# Patient Record
Sex: Male | Born: 1950 | Race: White | Hispanic: Yes | Marital: Single | Smoking: Never smoker
Health system: Southern US, Community
[De-identification: ages and names within clinical notes are randomized; demographics above are authoritative.]

## PROBLEM LIST (undated history)

## (undated) DIAGNOSIS — E119 Type 2 diabetes mellitus without complications: Secondary | ICD-10-CM

## (undated) DIAGNOSIS — H409 Unspecified glaucoma: Secondary | ICD-10-CM

## (undated) DIAGNOSIS — M1990 Unspecified osteoarthritis, unspecified site: Secondary | ICD-10-CM

## (undated) DIAGNOSIS — Z860100 Personal history of colon polyps, unspecified: Secondary | ICD-10-CM

## (undated) DIAGNOSIS — F419 Anxiety disorder, unspecified: Secondary | ICD-10-CM

## (undated) DIAGNOSIS — E785 Hyperlipidemia, unspecified: Secondary | ICD-10-CM

## (undated) DIAGNOSIS — M545 Low back pain, unspecified: Secondary | ICD-10-CM

## (undated) DIAGNOSIS — F32A Depression, unspecified: Secondary | ICD-10-CM

## (undated) DIAGNOSIS — K219 Gastro-esophageal reflux disease without esophagitis: Secondary | ICD-10-CM

## (undated) DIAGNOSIS — D649 Anemia, unspecified: Secondary | ICD-10-CM

## (undated) DIAGNOSIS — I10 Essential (primary) hypertension: Secondary | ICD-10-CM

## (undated) HISTORY — DX: Personal history of colon polyps, unspecified: Z86.0100

## (undated) HISTORY — DX: Gastro-esophageal reflux disease without esophagitis: K21.9

## (undated) HISTORY — DX: Type 2 diabetes mellitus without complications: E11.9

## (undated) HISTORY — DX: Depression, unspecified: F32.A

## (undated) HISTORY — DX: Unspecified glaucoma: H40.9

## (undated) HISTORY — DX: Anxiety disorder, unspecified: F41.9

## (undated) HISTORY — DX: Essential (primary) hypertension: I10

## (undated) HISTORY — DX: Hyperlipidemia, unspecified: E78.5

## (undated) HISTORY — DX: Anemia, unspecified: D64.9

## (undated) HISTORY — DX: Low back pain, unspecified: M54.50

## (undated) HISTORY — PX: BACK SURGERY: SHX140

## (undated) HISTORY — DX: Unspecified osteoarthritis, unspecified site: M19.90

## (undated) HISTORY — DX: Personal history of colonic polyps: Z86.010

---
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
## 1998-02-11 ENCOUNTER — Encounter: Admission: RE | Admit: 1998-02-11 | Discharge: 1998-05-12 | Payer: Self-pay | Admitting: *Deleted

## 1998-02-18 ENCOUNTER — Encounter: Admission: RE | Admit: 1998-02-18 | Discharge: 1998-04-09 | Payer: Self-pay | Admitting: *Deleted

## 1998-02-25 ENCOUNTER — Encounter: Payer: Self-pay | Admitting: *Deleted

## 1998-03-21 ENCOUNTER — Encounter: Admission: RE | Admit: 1998-03-21 | Discharge: 1998-06-19 | Payer: Self-pay | Admitting: *Deleted

## 1998-04-15 ENCOUNTER — Encounter: Admission: RE | Admit: 1998-04-15 | Discharge: 1998-07-14 | Payer: Self-pay | Admitting: *Deleted

## 1999-01-03 ENCOUNTER — Encounter: Admission: RE | Admit: 1999-01-03 | Discharge: 1999-01-03 | Payer: Self-pay | Admitting: Internal Medicine

## 1999-01-17 ENCOUNTER — Encounter: Admission: RE | Admit: 1999-01-17 | Discharge: 1999-01-17 | Payer: Self-pay | Admitting: Internal Medicine

## 2000-07-29 ENCOUNTER — Ambulatory Visit (HOSPITAL_COMMUNITY): Admission: RE | Admit: 2000-07-29 | Discharge: 2000-07-29 | Payer: Self-pay | Admitting: Family Medicine

## 2000-07-29 ENCOUNTER — Encounter: Payer: Self-pay | Admitting: Family Medicine

## 2000-08-26 ENCOUNTER — Encounter: Payer: Self-pay | Admitting: Emergency Medicine

## 2000-08-26 ENCOUNTER — Emergency Department (HOSPITAL_COMMUNITY): Admission: EM | Admit: 2000-08-26 | Discharge: 2000-08-27 | Payer: Self-pay | Admitting: Emergency Medicine

## 2002-02-19 ENCOUNTER — Emergency Department (HOSPITAL_COMMUNITY): Admission: EM | Admit: 2002-02-19 | Discharge: 2002-02-19 | Payer: Self-pay | Admitting: Emergency Medicine

## 2005-08-11 ENCOUNTER — Ambulatory Visit: Payer: Self-pay | Admitting: Nurse Practitioner

## 2005-08-12 ENCOUNTER — Ambulatory Visit: Payer: Self-pay | Admitting: *Deleted

## 2006-01-15 ENCOUNTER — Ambulatory Visit: Payer: Self-pay | Admitting: Nurse Practitioner

## 2006-01-21 ENCOUNTER — Ambulatory Visit: Payer: Self-pay | Admitting: Nurse Practitioner

## 2006-02-22 ENCOUNTER — Ambulatory Visit: Payer: Self-pay | Admitting: Nurse Practitioner

## 2006-03-29 ENCOUNTER — Ambulatory Visit: Payer: Self-pay | Admitting: Nurse Practitioner

## 2006-04-29 ENCOUNTER — Ambulatory Visit: Payer: Self-pay | Admitting: Nurse Practitioner

## 2006-05-10 ENCOUNTER — Ambulatory Visit: Payer: Self-pay | Admitting: Nurse Practitioner

## 2006-05-27 ENCOUNTER — Ambulatory Visit: Payer: Self-pay | Admitting: Nurse Practitioner

## 2006-08-03 ENCOUNTER — Ambulatory Visit: Payer: Self-pay | Admitting: Nurse Practitioner

## 2006-10-18 ENCOUNTER — Ambulatory Visit: Payer: Self-pay | Admitting: Internal Medicine

## 2006-10-18 ENCOUNTER — Encounter (INDEPENDENT_AMBULATORY_CARE_PROVIDER_SITE_OTHER): Payer: Self-pay | Admitting: Nurse Practitioner

## 2006-10-18 LAB — CONVERTED CEMR LAB

## 2006-11-24 ENCOUNTER — Encounter (INDEPENDENT_AMBULATORY_CARE_PROVIDER_SITE_OTHER): Payer: Self-pay | Admitting: *Deleted

## 2006-12-10 ENCOUNTER — Ambulatory Visit: Payer: Self-pay | Admitting: Internal Medicine

## 2006-12-10 ENCOUNTER — Encounter (INDEPENDENT_AMBULATORY_CARE_PROVIDER_SITE_OTHER): Payer: Self-pay | Admitting: Nurse Practitioner

## 2006-12-10 LAB — CONVERTED CEMR LAB
ALT: 77 units/L — ABNORMAL HIGH (ref 0–53)
Albumin: 4.7 g/dL (ref 3.5–5.2)
CO2: 26 meq/L (ref 19–32)
Calcium: 9.1 mg/dL (ref 8.4–10.5)
Chloride: 102 meq/L (ref 96–112)
Cholesterol: 181 mg/dL (ref 0–200)
Glucose, Bld: 111 mg/dL — ABNORMAL HIGH (ref 70–99)
Sodium: 142 meq/L (ref 135–145)
Total Protein: 7.8 g/dL (ref 6.0–8.3)

## 2007-01-14 ENCOUNTER — Ambulatory Visit: Payer: Self-pay | Admitting: Internal Medicine

## 2007-03-02 ENCOUNTER — Ambulatory Visit: Payer: Self-pay | Admitting: Internal Medicine

## 2007-03-02 ENCOUNTER — Encounter (INDEPENDENT_AMBULATORY_CARE_PROVIDER_SITE_OTHER): Payer: Self-pay | Admitting: Nurse Practitioner

## 2007-03-02 LAB — CONVERTED CEMR LAB
Albumin: 4.7 g/dL (ref 3.5–5.2)
Alkaline Phosphatase: 75 units/L (ref 39–117)
CO2: 28 meq/L (ref 19–32)
Calcium: 9.2 mg/dL (ref 8.4–10.5)
Chloride: 104 meq/L (ref 96–112)
Glucose, Bld: 74 mg/dL (ref 70–99)
Potassium: 4.1 meq/L (ref 3.5–5.3)
Sodium: 141 meq/L (ref 135–145)
Total Protein: 7.4 g/dL (ref 6.0–8.3)

## 2007-03-07 ENCOUNTER — Encounter (INDEPENDENT_AMBULATORY_CARE_PROVIDER_SITE_OTHER): Payer: Self-pay | Admitting: Nurse Practitioner

## 2007-03-07 ENCOUNTER — Ambulatory Visit: Payer: Self-pay | Admitting: Internal Medicine

## 2007-03-07 LAB — CONVERTED CEMR LAB
Cholesterol: 133 mg/dL (ref 0–200)
LDL Cholesterol: 44 mg/dL (ref 0–99)
Triglycerides: 282 mg/dL — ABNORMAL HIGH (ref <150)

## 2007-05-03 ENCOUNTER — Ambulatory Visit: Payer: Self-pay | Admitting: Internal Medicine

## 2007-05-03 ENCOUNTER — Encounter (INDEPENDENT_AMBULATORY_CARE_PROVIDER_SITE_OTHER): Payer: Self-pay | Admitting: Nurse Practitioner

## 2007-05-03 LAB — CONVERTED CEMR LAB
AST: 22 units/L (ref 0–37)
Albumin: 4.6 g/dL (ref 3.5–5.2)
HDL: 34 mg/dL — ABNORMAL LOW (ref >39)
Total Bilirubin: 0.6 mg/dL (ref 0.3–1.2)
Total CHOL/HDL Ratio: 3.6

## 2007-06-29 ENCOUNTER — Ambulatory Visit: Payer: Self-pay | Admitting: Family Medicine

## 2007-07-27 ENCOUNTER — Ambulatory Visit (HOSPITAL_BASED_OUTPATIENT_CLINIC_OR_DEPARTMENT_OTHER): Admission: RE | Admit: 2007-07-27 | Discharge: 2007-07-27 | Payer: Self-pay | Admitting: Family Medicine

## 2007-08-06 ENCOUNTER — Ambulatory Visit: Payer: Self-pay | Admitting: Internal Medicine

## 2007-10-14 ENCOUNTER — Emergency Department (HOSPITAL_COMMUNITY): Admission: EM | Admit: 2007-10-14 | Discharge: 2007-10-14 | Payer: Self-pay | Admitting: Emergency Medicine

## 2007-10-21 ENCOUNTER — Ambulatory Visit: Payer: Self-pay | Admitting: Family Medicine

## 2007-10-21 LAB — CONVERTED CEMR LAB
Free T4: 1.34 ng/dL (ref 0.89–1.80)
Microalb, Ur: 0.65 mg/dL (ref 0.00–1.89)

## 2008-01-05 ENCOUNTER — Ambulatory Visit: Payer: Self-pay | Admitting: Family Medicine

## 2008-01-23 ENCOUNTER — Ambulatory Visit: Payer: Self-pay | Admitting: Internal Medicine

## 2008-01-23 ENCOUNTER — Encounter (INDEPENDENT_AMBULATORY_CARE_PROVIDER_SITE_OTHER): Payer: Self-pay | Admitting: Internal Medicine

## 2008-01-23 LAB — CONVERTED CEMR LAB
Albumin: 4.7 g/dL (ref 3.5–5.2)
Alkaline Phosphatase: 82 units/L (ref 39–117)
BUN: 10 mg/dL (ref 6–23)
Calcium: 10 mg/dL (ref 8.4–10.5)
Chloride: 102 meq/L (ref 96–112)
Glucose, Bld: 166 mg/dL — ABNORMAL HIGH (ref 70–99)
Hgb A1c MFr Bld: 6.9 % — ABNORMAL HIGH (ref 4.6–6.1)
Potassium: 4.1 meq/L (ref 3.5–5.3)

## 2008-04-23 ENCOUNTER — Encounter (INDEPENDENT_AMBULATORY_CARE_PROVIDER_SITE_OTHER): Payer: Self-pay | Admitting: Internal Medicine

## 2008-04-23 ENCOUNTER — Ambulatory Visit: Payer: Self-pay | Admitting: Internal Medicine

## 2008-04-23 ENCOUNTER — Other Ambulatory Visit: Admission: RE | Admit: 2008-04-23 | Discharge: 2008-04-23 | Payer: Self-pay | Admitting: Internal Medicine

## 2008-04-23 LAB — CONVERTED CEMR LAB
AST: 28 units/L (ref 0–37)
Albumin: 4.6 g/dL (ref 3.5–5.2)
Alkaline Phosphatase: 86 units/L (ref 39–117)
Potassium: 4.2 meq/L (ref 3.5–5.3)
Sodium: 142 meq/L (ref 135–145)
Total Protein: 7.5 g/dL (ref 6.0–8.3)

## 2008-06-12 ENCOUNTER — Ambulatory Visit: Payer: Self-pay | Admitting: Internal Medicine

## 2008-06-12 ENCOUNTER — Encounter (INDEPENDENT_AMBULATORY_CARE_PROVIDER_SITE_OTHER): Payer: Self-pay | Admitting: Internal Medicine

## 2008-06-12 LAB — CONVERTED CEMR LAB: LDL Cholesterol: 60 mg/dL (ref 0–99)

## 2008-07-20 ENCOUNTER — Ambulatory Visit: Payer: Self-pay | Admitting: Internal Medicine

## 2008-08-13 ENCOUNTER — Emergency Department (HOSPITAL_COMMUNITY): Admission: EM | Admit: 2008-08-13 | Discharge: 2008-08-13 | Payer: Self-pay | Admitting: Emergency Medicine

## 2008-08-28 ENCOUNTER — Emergency Department (HOSPITAL_COMMUNITY): Admission: EM | Admit: 2008-08-28 | Discharge: 2008-08-28 | Payer: Self-pay | Admitting: Emergency Medicine

## 2008-09-03 ENCOUNTER — Encounter (INDEPENDENT_AMBULATORY_CARE_PROVIDER_SITE_OTHER): Payer: Self-pay | Admitting: Internal Medicine

## 2008-09-03 ENCOUNTER — Ambulatory Visit: Payer: Self-pay | Admitting: Internal Medicine

## 2008-09-03 LAB — CONVERTED CEMR LAB: Microalb, Ur: 0.5 mg/dL (ref 0.00–1.89)

## 2008-11-12 ENCOUNTER — Emergency Department (HOSPITAL_COMMUNITY): Admission: EM | Admit: 2008-11-12 | Discharge: 2008-11-12 | Payer: Self-pay | Admitting: Emergency Medicine

## 2008-12-04 ENCOUNTER — Ambulatory Visit: Payer: Self-pay | Admitting: Internal Medicine

## 2008-12-18 ENCOUNTER — Ambulatory Visit: Payer: Self-pay | Admitting: Psychology

## 2008-12-21 ENCOUNTER — Encounter: Admission: RE | Admit: 2008-12-21 | Discharge: 2008-12-21 | Payer: Self-pay | Admitting: Neurology

## 2008-12-25 ENCOUNTER — Encounter: Admission: RE | Admit: 2008-12-25 | Discharge: 2008-12-25 | Payer: Self-pay | Admitting: Neurology

## 2009-01-07 ENCOUNTER — Ambulatory Visit: Payer: Self-pay | Admitting: Internal Medicine

## 2009-01-07 ENCOUNTER — Encounter (INDEPENDENT_AMBULATORY_CARE_PROVIDER_SITE_OTHER): Payer: Self-pay | Admitting: Internal Medicine

## 2009-01-07 LAB — CONVERTED CEMR LAB
ALT: 70 units/L — ABNORMAL HIGH (ref 0–53)
AST: 28 units/L (ref 0–37)
Albumin: 4.6 g/dL (ref 3.5–5.2)
Alkaline Phosphatase: 73 units/L (ref 39–117)
Calcium: 9.6 mg/dL (ref 8.4–10.5)
Chloride: 101 meq/L (ref 96–112)
Creatinine, Ser: 0.84 mg/dL (ref 0.40–1.50)
LDL Cholesterol: 23 mg/dL (ref 0–99)
Potassium: 4.1 meq/L (ref 3.5–5.3)
Total CHOL/HDL Ratio: 4.3

## 2009-01-11 ENCOUNTER — Ambulatory Visit: Payer: Self-pay | Admitting: Cardiology

## 2009-01-11 DIAGNOSIS — R079 Chest pain, unspecified: Secondary | ICD-10-CM | POA: Insufficient documentation

## 2009-01-11 DIAGNOSIS — E785 Hyperlipidemia, unspecified: Secondary | ICD-10-CM | POA: Insufficient documentation

## 2009-01-11 DIAGNOSIS — R0602 Shortness of breath: Secondary | ICD-10-CM | POA: Insufficient documentation

## 2009-01-23 ENCOUNTER — Telehealth (INDEPENDENT_AMBULATORY_CARE_PROVIDER_SITE_OTHER): Payer: Self-pay

## 2009-01-24 ENCOUNTER — Ambulatory Visit: Payer: Self-pay | Admitting: Cardiology

## 2009-01-24 ENCOUNTER — Ambulatory Visit (HOSPITAL_COMMUNITY): Admission: RE | Admit: 2009-01-24 | Discharge: 2009-01-24 | Payer: Self-pay | Admitting: Cardiology

## 2009-01-24 ENCOUNTER — Ambulatory Visit: Payer: Self-pay | Admitting: Internal Medicine

## 2009-01-24 ENCOUNTER — Ambulatory Visit: Payer: Self-pay

## 2009-01-24 ENCOUNTER — Encounter: Payer: Self-pay | Admitting: Cardiology

## 2009-01-24 ENCOUNTER — Encounter (HOSPITAL_COMMUNITY): Admission: RE | Admit: 2009-01-24 | Discharge: 2009-03-08 | Payer: Self-pay | Admitting: Cardiology

## 2009-01-29 ENCOUNTER — Ambulatory Visit: Payer: Self-pay | Admitting: Internal Medicine

## 2009-01-29 ENCOUNTER — Encounter (INDEPENDENT_AMBULATORY_CARE_PROVIDER_SITE_OTHER): Payer: Self-pay | Admitting: Internal Medicine

## 2009-01-29 LAB — CONVERTED CEMR LAB
ALT: 66 units/L — ABNORMAL HIGH (ref 0–53)
AST: 37 units/L (ref 0–37)
Cholesterol: 190 mg/dL (ref 0–200)
Indirect Bilirubin: 0.4 mg/dL (ref 0.0–0.9)
Total Protein: 7.4 g/dL (ref 6.0–8.3)
Triglycerides: 172 mg/dL — ABNORMAL HIGH (ref <150)
VLDL: 34 mg/dL (ref 0–40)

## 2009-02-05 ENCOUNTER — Ambulatory Visit: Payer: Self-pay | Admitting: Cardiology

## 2009-02-05 DIAGNOSIS — I10 Essential (primary) hypertension: Secondary | ICD-10-CM | POA: Insufficient documentation

## 2009-02-08 ENCOUNTER — Encounter: Payer: Self-pay | Admitting: Cardiology

## 2009-02-08 ENCOUNTER — Ambulatory Visit: Admission: RE | Admit: 2009-02-08 | Discharge: 2009-02-08 | Payer: Self-pay | Admitting: Cardiology

## 2009-02-14 ENCOUNTER — Ambulatory Visit: Payer: Self-pay | Admitting: Cardiology

## 2009-02-14 ENCOUNTER — Ambulatory Visit (HOSPITAL_COMMUNITY): Admission: RE | Admit: 2009-02-14 | Discharge: 2009-02-14 | Payer: Self-pay | Admitting: Cardiology

## 2009-02-22 ENCOUNTER — Ambulatory Visit: Payer: Self-pay | Admitting: Internal Medicine

## 2009-03-04 ENCOUNTER — Ambulatory Visit: Payer: Self-pay | Admitting: Cardiology

## 2009-03-12 ENCOUNTER — Telehealth: Payer: Self-pay | Admitting: Cardiology

## 2009-03-12 LAB — CONVERTED CEMR LAB
ALT: 57 units/L — ABNORMAL HIGH (ref 0–53)
AST: 32 units/L (ref 0–37)
Alkaline Phosphatase: 64 units/L (ref 39–117)
Bilirubin, Direct: 0 mg/dL (ref 0.0–0.3)
Direct LDL: 81 mg/dL
Total Bilirubin: 0.9 mg/dL (ref 0.3–1.2)
Total CHOL/HDL Ratio: 5
Triglycerides: 367 mg/dL — ABNORMAL HIGH (ref 0.0–149.0)

## 2009-04-02 ENCOUNTER — Ambulatory Visit: Payer: Self-pay | Admitting: Cardiology

## 2009-04-10 ENCOUNTER — Ambulatory Visit: Payer: Self-pay | Admitting: Internal Medicine

## 2009-05-15 ENCOUNTER — Ambulatory Visit: Payer: Self-pay | Admitting: Internal Medicine

## 2009-05-15 LAB — CONVERTED CEMR LAB: Rhuematoid fact SerPl-aCnc: <20 intl units/mL (ref 0–20)

## 2009-05-27 ENCOUNTER — Ambulatory Visit: Payer: Self-pay | Admitting: Cardiology

## 2009-05-28 LAB — CONVERTED CEMR LAB
Albumin: 4 g/dL (ref 3.5–5.2)
CO2: 31 meq/L (ref 19–32)
Chloride: 105 meq/L (ref 96–112)
Cholesterol: 179 mg/dL (ref 0–200)
Creatinine, Ser: 1 mg/dL (ref 0.4–1.5)
Direct LDL: 81.9 mg/dL
HDL: 33.9 mg/dL — ABNORMAL LOW (ref >39.00)
Potassium: 4 meq/L (ref 3.5–5.1)
Sodium: 140 meq/L (ref 135–145)
Total CHOL/HDL Ratio: 5
Total Protein: 6.9 g/dL (ref 6.0–8.3)
Triglycerides: 449 mg/dL — ABNORMAL HIGH (ref 0.0–149.0)
VLDL: 89.8 mg/dL — ABNORMAL HIGH (ref 0.0–40.0)

## 2009-06-03 ENCOUNTER — Ambulatory Visit: Payer: Self-pay | Admitting: Internal Medicine

## 2009-06-05 ENCOUNTER — Ambulatory Visit: Payer: Self-pay | Admitting: Internal Medicine

## 2009-06-11 ENCOUNTER — Ambulatory Visit: Payer: Self-pay | Admitting: Internal Medicine

## 2009-06-25 ENCOUNTER — Ambulatory Visit: Payer: Self-pay | Admitting: Cardiology

## 2009-07-01 ENCOUNTER — Ambulatory Visit: Payer: Self-pay | Admitting: Internal Medicine

## 2009-07-09 ENCOUNTER — Ambulatory Visit: Payer: Self-pay | Admitting: Cardiology

## 2009-07-12 ENCOUNTER — Telehealth: Payer: Self-pay | Admitting: Internal Medicine

## 2009-07-12 DIAGNOSIS — R748 Abnormal levels of other serum enzymes: Secondary | ICD-10-CM | POA: Insufficient documentation

## 2009-07-12 LAB — CONVERTED CEMR LAB
ALT: 198 units/L — ABNORMAL HIGH (ref 0–53)
AST: 106 units/L — ABNORMAL HIGH (ref 0–37)
Bilirubin, Direct: 0.1 mg/dL (ref 0.0–0.3)
Total Bilirubin: 0.8 mg/dL (ref 0.3–1.2)
Total Protein: 6.9 g/dL (ref 6.0–8.3)

## 2009-07-16 ENCOUNTER — Ambulatory Visit: Payer: Self-pay | Admitting: Gastroenterology

## 2009-07-16 DIAGNOSIS — R740 Nonspecific elevation of levels of transaminase and lactic acid dehydrogenase [LDH]: Secondary | ICD-10-CM

## 2009-07-16 DIAGNOSIS — R1084 Generalized abdominal pain: Secondary | ICD-10-CM

## 2009-07-16 DIAGNOSIS — Z862 Personal history of diseases of the blood and blood-forming organs and certain disorders involving the immune mechanism: Secondary | ICD-10-CM

## 2009-07-16 DIAGNOSIS — K219 Gastro-esophageal reflux disease without esophagitis: Secondary | ICD-10-CM | POA: Insufficient documentation

## 2009-07-16 DIAGNOSIS — Z8639 Personal history of other endocrine, nutritional and metabolic disease: Secondary | ICD-10-CM

## 2009-07-16 DIAGNOSIS — E119 Type 2 diabetes mellitus without complications: Secondary | ICD-10-CM

## 2009-07-16 LAB — CONVERTED CEMR LAB
A-1 Antitrypsin, Ser: 163 mg/dL (ref 83–200)
Ferritin: 28.4 ng/mL (ref 22.0–322.0)
HCV Ab: NEGATIVE
Hep B S Ab: NEGATIVE
INR: 1.1 — ABNORMAL HIGH (ref 0.8–1.0)
Prothrombin Time: 11 s (ref 9.1–11.7)
Saturation Ratios: 15.8 % — ABNORMAL LOW (ref 20.0–50.0)

## 2009-07-17 ENCOUNTER — Telehealth: Payer: Self-pay | Admitting: Nurse Practitioner

## 2009-07-18 ENCOUNTER — Ambulatory Visit (HOSPITAL_COMMUNITY): Admission: RE | Admit: 2009-07-18 | Discharge: 2009-07-18 | Payer: Self-pay | Admitting: Gastroenterology

## 2009-07-26 ENCOUNTER — Ambulatory Visit: Payer: Self-pay | Admitting: Cardiology

## 2009-07-31 LAB — CONVERTED CEMR LAB
ALT: 152 units/L — ABNORMAL HIGH (ref 0–53)
AST: 79 units/L — ABNORMAL HIGH (ref 0–37)
Albumin: 4.2 g/dL (ref 3.5–5.2)
Cholesterol: 231 mg/dL — ABNORMAL HIGH (ref 0–200)
Total Bilirubin: 0.7 mg/dL (ref 0.3–1.2)
Triglycerides: 378 mg/dL — ABNORMAL HIGH (ref 0.0–149.0)

## 2009-08-07 ENCOUNTER — Ambulatory Visit: Payer: Self-pay | Admitting: Internal Medicine

## 2009-08-07 LAB — CONVERTED CEMR LAB
ALT: 149 units/L — ABNORMAL HIGH (ref 0–53)
AST: 68 units/L — ABNORMAL HIGH (ref 0–37)
Alkaline Phosphatase: 78 units/L (ref 39–117)
Bilirubin, Direct: 0.1 mg/dL (ref 0.0–0.3)
Indirect Bilirubin: 0.5 mg/dL (ref 0.0–0.9)
Total Protein: 7.1 g/dL (ref 6.0–8.3)

## 2009-08-27 ENCOUNTER — Ambulatory Visit: Payer: Self-pay | Admitting: Internal Medicine

## 2009-08-30 ENCOUNTER — Ambulatory Visit: Payer: Self-pay | Admitting: Gastroenterology

## 2009-08-30 LAB — CONVERTED CEMR LAB
ALT: 132 units/L — ABNORMAL HIGH (ref 0–53)
AST: 56 units/L — ABNORMAL HIGH (ref 0–37)

## 2009-09-03 ENCOUNTER — Ambulatory Visit: Payer: Self-pay | Admitting: Internal Medicine

## 2009-09-23 ENCOUNTER — Ambulatory Visit: Payer: Self-pay | Admitting: Gastroenterology

## 2009-09-23 LAB — CONVERTED CEMR LAB
ALT: 113 units/L — ABNORMAL HIGH (ref 0–53)
Alkaline Phosphatase: 79 units/L (ref 39–117)
Bilirubin, Direct: 0.1 mg/dL (ref 0.0–0.3)
Total Protein: 7.1 g/dL (ref 6.0–8.3)

## 2009-10-01 ENCOUNTER — Encounter (INDEPENDENT_AMBULATORY_CARE_PROVIDER_SITE_OTHER): Payer: Self-pay | Admitting: *Deleted

## 2009-10-01 ENCOUNTER — Ambulatory Visit: Payer: Self-pay | Admitting: Gastroenterology

## 2009-10-01 DIAGNOSIS — K625 Hemorrhage of anus and rectum: Secondary | ICD-10-CM

## 2009-10-02 LAB — CONVERTED CEMR LAB
ALT: 111 units/L — ABNORMAL HIGH (ref 0–53)
AST: 52 units/L — ABNORMAL HIGH (ref 0–37)
Albumin: 4.2 g/dL (ref 3.5–5.2)
Alkaline Phosphatase: 76 units/L (ref 39–117)
BUN: 11 mg/dL (ref 6–23)
Basophils Absolute: 0 10*3/uL (ref 0.0–0.1)
Basophils Relative: 0.6 % (ref 0.0–3.0)
CO2: 31 meq/L (ref 19–32)
Calcium: 9 mg/dL (ref 8.4–10.5)
Chloride: 101 meq/L (ref 96–112)
Creatinine, Ser: 0.7 mg/dL (ref 0.4–1.5)
Eosinophils Absolute: 0.1 10*3/uL (ref 0.0–0.7)
Eosinophils Relative: 2.1 % (ref 0.0–5.0)
GFR calc non Af Amer: 119.14 mL/min (ref >60)
Glucose, Bld: 159 mg/dL — ABNORMAL HIGH (ref 70–99)
HCT: 42.2 % (ref 39.0–52.0)
Hemoglobin: 14.7 g/dL (ref 13.0–17.0)
INR: 1 (ref 0.8–1.0)
IgA: 261 mg/dL (ref 68–378)
Lymphocytes Relative: 38.8 % (ref 12.0–46.0)
Lymphs Abs: 2.1 10*3/uL (ref 0.7–4.0)
MCHC: 34.8 g/dL (ref 30.0–36.0)
MCV: 86.5 fL (ref 78.0–100.0)
Monocytes Absolute: 0.5 10*3/uL (ref 0.1–1.0)
Monocytes Relative: 8.7 % (ref 3.0–12.0)
Neutro Abs: 2.7 10*3/uL (ref 1.4–7.7)
Neutrophils Relative %: 49.8 % (ref 43.0–77.0)
Platelets: 154 10*3/uL (ref 150.0–400.0)
Potassium: 4.1 meq/L (ref 3.5–5.1)
Prothrombin Time: 11.1 s (ref 9.7–11.8)
RBC: 4.88 M/uL (ref 4.22–5.81)
RDW: 13.6 % (ref 11.5–14.6)
Sodium: 137 meq/L (ref 135–145)
Total Bilirubin: 0.7 mg/dL (ref 0.3–1.2)
Total Protein: 7.2 g/dL (ref 6.0–8.3)
WBC: 5.5 10*3/uL (ref 4.5–10.5)

## 2009-10-03 LAB — CONVERTED CEMR LAB: Tissue Transglutaminase Ab, IgA: 27.7 units — ABNORMAL HIGH (ref <20)

## 2009-10-09 ENCOUNTER — Ambulatory Visit: Payer: Self-pay | Admitting: Internal Medicine

## 2009-10-09 LAB — CONVERTED CEMR LAB
ALT: 98 units/L — ABNORMAL HIGH (ref 0–53)
AST: 46 units/L — ABNORMAL HIGH (ref 0–37)
Albumin: 4.7 g/dL (ref 3.5–5.2)
Alkaline Phosphatase: 66 units/L (ref 39–117)
BUN: 8 mg/dL (ref 6–23)
Basophils Absolute: 0 10*3/uL (ref 0.0–0.1)
Basophils Relative: 0 % (ref 0–1)
Calcium: 9.2 mg/dL (ref 8.4–10.5)
Chloride: 102 meq/L (ref 96–112)
Eosinophils Absolute: 0.1 10*3/uL (ref 0.0–0.7)
Helicobacter Pylori Antibody-IgG: >8 — ABNORMAL HIGH
Hep A Total Ab: POSITIVE — AB
Hep B Core Total Ab: NEGATIVE
Hep B S Ab: NEGATIVE
Hgb A1c MFr Bld: 6.2 % — ABNORMAL HIGH (ref <5.7)
Lipase: 21 units/L (ref 0–75)
MCHC: 33 g/dL (ref 30.0–36.0)
MCV: 87.7 fL (ref 78.0–100.0)
Neutro Abs: 2.5 10*3/uL (ref 1.7–7.7)
Neutrophils Relative %: 50 % (ref 43–77)
Platelets: 153 10*3/uL (ref 150–400)
Potassium: 3.8 meq/L (ref 3.5–5.3)
RBC: 5.29 M/uL (ref 4.22–5.81)
RDW: 13.7 % (ref 11.5–15.5)
Rhuematoid fact SerPl-aCnc: <20 intl units/mL (ref 0–20)
Sodium: 139 meq/L (ref 135–145)

## 2009-10-10 ENCOUNTER — Ambulatory Visit: Payer: Self-pay | Admitting: Internal Medicine

## 2009-10-11 ENCOUNTER — Ambulatory Visit: Payer: Self-pay | Admitting: Gastroenterology

## 2009-10-14 ENCOUNTER — Encounter: Payer: Self-pay | Admitting: Gastroenterology

## 2009-10-15 ENCOUNTER — Telehealth: Payer: Self-pay | Admitting: Gastroenterology

## 2009-10-16 ENCOUNTER — Telehealth: Payer: Self-pay | Admitting: Gastroenterology

## 2009-10-21 ENCOUNTER — Telehealth: Payer: Self-pay | Admitting: Gastroenterology

## 2009-11-25 ENCOUNTER — Emergency Department (HOSPITAL_COMMUNITY): Admission: EM | Admit: 2009-11-25 | Discharge: 2009-11-25 | Payer: Self-pay | Admitting: Emergency Medicine

## 2009-12-09 ENCOUNTER — Ambulatory Visit: Payer: Self-pay | Admitting: Gastroenterology

## 2009-12-09 LAB — CONVERTED CEMR LAB
AST: 34 units/L (ref 0–37)
Alkaline Phosphatase: 79 units/L (ref 39–117)
BUN: 15 mg/dL (ref 6–23)
Glucose, Bld: 221 mg/dL — ABNORMAL HIGH (ref 70–99)
Potassium: 3.7 meq/L (ref 3.5–5.1)
Sodium: 133 meq/L — ABNORMAL LOW (ref 135–145)
Total Bilirubin: 0.8 mg/dL (ref 0.3–1.2)
Total Protein: 7.1 g/dL (ref 6.0–8.3)

## 2009-12-10 ENCOUNTER — Ambulatory Visit: Payer: Self-pay | Admitting: Gastroenterology

## 2010-03-30 ENCOUNTER — Encounter: Payer: Self-pay | Admitting: Gastroenterology

## 2010-04-06 LAB — CONVERTED CEMR LAB
ALT: 156 units/L — ABNORMAL HIGH (ref 0–53)
AST: 80 units/L — ABNORMAL HIGH (ref 0–37)
Bilirubin, Direct: 0.1 mg/dL (ref 0.0–0.3)
Total Bilirubin: 0.7 mg/dL (ref 0.3–1.2)

## 2010-04-08 NOTE — Progress Notes (Signed)
Summary: cx rx  Phone Note Call from Patient Call back at Home Phone [phone]   Caller: Patient Call For: Christella Hartigan Reason for Call: Talk to Nurse Summary of Call: Patient states that [facility] had given him the meds Prevpac and just wanted to let nurse know that. Patient states that the one we called in to [facility] can be cancelled. Initial call taken by: Tawni Levy,  October 16, 2009 3:11 PM  Follow-up for Phone Call        rx cx at pharmacy Follow-up by: Chales Abrahams CMA Duncan Dull),  October 16, 2009 3:27 PM

## 2010-04-08 NOTE — Progress Notes (Signed)
Summary: Pt doing good  Phone Note Call from Patient Call back at Home Phone [phone]   Call For: Dr Christella Hartigan Summary of Call: Pt called back to say he is doing good and everything is fine- no problems at all.  Initial call taken by: Leanor Kail [facility],  October 15, 2009 12:06 PM

## 2010-04-08 NOTE — Letter (Signed)
Summary: [facility]- letter to check on pt  [facility]  [address] [place], [place] [address]   Phone: [phone]  Fax: [phone]      October 14, 2009 MRN: [id number]   Jesus [name] [address] [place], [place]  [address]      As per our conversation, I am sending you a letter to check on you after your procedure.  If you  have any questions or concerns, please contact us [phone].  Sincerely,    Dr Rob Bunting  Written by Oda Cogan, RN  Appended Document: [facility]- letter to check on pt letter mailed

## 2010-04-08 NOTE — Assessment & Plan Note (Signed)
Summary: elevated LFT/sheri   History of Present Illness Visit Type: Initial Consult Primary GI MD: Rob Bunting MD Primary Provider: Tyler Deis Id-Din Requesting Provider: Marca Ancona, MD Chief Complaint: Patient referred for elevated liver test. Patient complains of midabdominal pain which he has had for years. He complains of constipation as well, but denies any blood in his stool.  History of Present Illness:   Patient is a 60 year old non English speaking male here with a [employer] interpreter for evaluation of elevated LFTs. Patient describes at least a 10 year history of abnormal LFTs. He denies , jaundice, dark urine. No history of blood transfusions, tatooes, IV drug use. No history of ETOH use. Father had cirrhosis (non-drinker)  Patient has history longstanding GERD which has been well controlled with twice daily PPI.   He has chronic (years) diffuse lower abdominal discomfort, worse with meals, relieved with defecation. No change in bowel habits, he has 3-4 normal BMs a day. No rectal bleeding.      GI Review of Systems    Reports abdominal pain.     Location of  Abdominal pain: lower abdomen.       Denies anal fissure, black tarry stools, change in bowel habit, constipation, diarrhea, diverticulosis, fecal incontinence, heme positive stool, hemorrhoids, irritable bowel syndrome, jaundice, light color stool, liver problems, rectal bleeding, and  rectal pain. Preventive Screening-Counseling & Management      Drug Use:  no.      Current Medications (verified): 1)  Omeprazole 20 Mg Cpdr (Omeprazole) .... Take One 30-60 Min Before First and Last Meals of The Day 2)  Metformin Hcl 500 Mg Tabs (Metformin Hcl) .Marland Kitchen.. 1 Tab Two Times A Day 3)  Lexapro 10 Mg Tabs (Escitalopram Oxalate) .... Take One Tab At Bedtime 4)  Aspir-Low 81 Mg Tbec (Aspirin) .... One Tablet Daily 5)  Diovan 80 Mg Tabs (Valsartan) .... One Daily 6)  Lovaza 1 Gm Caps (Omega-3-Acid Ethyl Esters) .... Two  Capsules Twice A Day 7)  Vitamin B-1 250 Mg Tabs (Thiamine Hcl) .... Take One By Mouth Once Daily 8)  Salsalate 500 Mg Tabs (Salsalate) .... Take One By Mouth Three Times A Day  Allergies (verified): No Known Drug Allergies  Past History:  Past Medical History: 1. GERD 2. DM2 3. HTN  ACE d/c April 10, 2009 due to suspicion of pseudoasthma 4. Hyperlipidemia 5. Depression 6. Arthritis 7. Chest pain: Lexiscan myoview (11/10): EF 69%, no ischemia or infarction. 8. Dyspnea with exertion: echo (11/10): EF 55-60%, no regional wall motion abnormalities, no significant valvular abnormalities.  RHC (12/10) with mean RA 6 mmHg, PA 22/10, mean PCWP , CI 2.65, SVO2 70%. PFTs (12/10) showed FEV1 63%, FVC 83%, ratio 77%, reponded to bronchodilator.  Moderate obstructive defect.  9. COPD: See above PFTs. NB  FV loop not cw copd so can't use the numbers 10.  Pseudoasthma/upper airways cough syndrome.  Improved off ACEI.  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  Speaks only [place], daughter translates.  Disabled, rare ETOH.  Patient never smoked regularly Illicit Drug Use - no Drug Use:  no  Review of Systems       The patient complains  of anxiety-new, back pain, change in vision, confusion, cough, depression-new, fatigue, headaches-new, itching, muscle pains/cramps, sleeping problems, thirst - excessive, urination - excessive, and urine leakage.  The patient denies allergy/sinus, anemia, arthritis/joint pain, blood in urine, breast changes/lumps, coughing up blood, fainting, fever, hearing problems, heart murmur, heart rhythm changes,  menstrual pain, night sweats, nosebleeds, pregnancy symptoms, shortness of breath, skin rash, sore throat, swelling of feet/legs, swollen lymph glands, thirst - excessive , urination - excessive , urination changes/pain, vision changes, and voice change.    Vital Signs:  Patient profile:   60 year old male Height:      67 inches Weight:      176.0 pounds BMI:     27.67 Pulse rate:   80 / minute Pulse rhythm:   regular BP sitting:   100 / 64  (left arm) Cuff size:   regular  Vitals Entered By: Harlow Mares CMA Duncan Dull) (Jul 16, 2009 1:38 PM)  Physical Exam  General:  Well developed, well nourished, no acute distress. Head:  Normocephalic and atraumatic. Eyes:  Conjunctiva pink, no icterus.  Mouth:  No oral lesions. Tongue moist.  Neck:  no obvious masses  Lungs:  Clear throughout to auscultation. Heart:  Regular rate and rhythm; no murmurs, rubs,  or bruits. Abdomen:  Abdomen soft, nontender, nondistended. No obvious masses or hepatomegaly.Normal bowel sounds.  Msk:  Symmetrical with no gross deformities. Normal posture. Extremities:  No palmar erythema, no edema.  Neurologic:  Alert and  oriented x4;  grossly normal neurologically. Skin:  Intact without significant lesions or rashes. Cervical Nodes:  No significant cervical adenopathy. Psych:  Alert and cooperative. Normal mood and affect.   Impression & Recommendations:  Problem # 1:  TRANSAMINASES, SERUM, ELEVATED (ICD-790.4) Assessment Deteriorated Several year history of mild transaminitis ( AST and ALT ranging 60-80s). Worsening transaminitis since 05/27/09 with AST 77 and ALT 156. Statin decreased, and then all together stopped on 4/18 after repeat labs revealed AST or 80 and ALT of 156. Repeat LFTs 5/3 revealed further rise in transaminases to AST of 106 and ALT 198. Reviewing records I see that patient was started on Salsalate sometime after 04/10/09.  Patient could have fatty liver disease based on history of mildly  elevated transaminases. His progressive transaminitis may be related to NSAIDS. Need to check for chronic viral hepatitis, genetic, and autoimmune liver diseases. Will obtain those labs as well as an [place] Patient's interpreter Raynelle Fanning) will be called with test results and any further recommendations based on those results. He may need eventual liver biopsy pending clinical course. Patient plans to call PCP about stopping Salsalate as it isn't helping his arthritis anyway.     Problem # 2:  SCREENING COLORECTAL-CANCER (ICD-V76.51) Assessment: Comment Only Needs eventual screening once abnormal LFTs are sorted out.   Problem # 3:  HYPERTENSION, UNSPECIFIED (ICD-401.9) Assessment: Comment Only  Problem # 4:  GERD (ICD-530.81) Assessment: Comment Only Longstanding, controlled on PPI twice daily.   Problem # 5:  HYPERLIPIDEMIA-MIXED (ICD-272.4) Assessment: Comment Only  Problem # 6:  DIABETES MELLITUS-TYPE II (ICD-250.00) Assessment: Comment Only  Other Orders: TLB-PT (Protime) (85610-PTP) TLB-IBC Pnl (Iron/FE;Transferrin) (83550-IBC) TLB-Ferritin (82728-FER) T-Alpha-1-Antitrypsin Tot ([address]) T-AMA [phone]) T-ANA [phone]) T-Anti SMA (57846-96295) T-Ceruloplasmin [id number]) T-Hepatitis B Surface Antibody (02725-36644) T-Hepatitis B Surface Antigen (03474-25956) T-Hepatitis C Anti HCV (38756) Ultrasound Abdomen (UAS)  Patient Instructions: 1)  We have scheduled the [place] at [facility] for 07-18-09 .  2)  We will arrange for the interpreter.

## 2010-04-08 NOTE — Assessment & Plan Note (Signed)
Summary: 3 month rov   Visit Type:  Follow-up Referring Provider:  Dr. Golden Circle Primary Provider:  Tyler Deis Id-Din  CC:  pt feeling better-pt no longer taking nabumetone.Marland Kitchen  History of Present Illness: 60 yo with history of DM2, HTN, and hyperlipidemia returns for evaluation of dyspnea.  He has not smoked for 25 years.  Echo showed normal EF and no significant valvular abnormality.  Myoview was negative.  Right heart cath was eventually done showing normal right and left heart filling pressures.  Finally, PFTs showed a moderate defect that was reversible with bronchodilation.   He was seen Dr. Sherene Sires with pulmonary who thought that he may have pseudoasthma/upper airways cough syndrome.  His symptoms have actually improved off ACEI and on proton pump inhibitor.  He is breathing better and walking 15-20 minutes at a time before he has to stop.  I recently cut back on his simvastatin because of elevated LFTs.    Labs (11/10): TG 172, HDL 35, LDL 121, ALT 66, AST 37 Labs (12/10): HDL 36, LDL 81 Labs (3/11): HDL 34, LDL 82, TGs 449, creatinine 1.0, AST 77, ALT 156  ECG: NSR, normal  Current Medications (verified): 1)  Glipizide 2.5 Mg Xr24h-Tab (Glipizide) .Marland Kitchen.. 1 Tab Once Daily 2)  Omeprazole 20 Mg Cpdr (Omeprazole) .... Take One 30-60 Min Before First and Last Meals of The Day 3)  Metformin Hcl 500 Mg Tabs (Metformin Hcl) .Marland Kitchen.. 1 Tab Two Times A Day 4)  Zocor 10 Mg Tabs (Simvastatin) .... One Tablet Daily 5)  Lexapro 10 Mg Tabs (Escitalopram Oxalate) .... Take One Tab At Bedtime 6)  Aspir-Low 81 Mg Tbec (Aspirin) .... One Tablet Daily 7)  Diovan 80 Mg Tabs (Valsartan) .... One Daily 8)  Lovaza 1 Gm Caps (Omega-3-Acid Ethyl Esters) .... Two Capsules Twice A Day 9)  Thiamine Hcl 100 Mg Tabs (Thiamine Hcl) .Marland Kitchen.. 1 Tab Bid 10)  Salsalate 500 Mg Tabs (Salsalate) .Marland Kitchen.. 1 Tab Two Times A Day Instead of N Abumetone  Allergies (verified): No Known Drug Allergies  Past History:  Past Medical History: 1.  GERD 2. DM2 3. HTN  ACE d/c April 10, 2009 due to suspicion of pseudoasthma 4. Hyperlipidemia 5. Depression 6. Arthritis 7. Chest pain: Lexiscan myoview (11/10): EF 69%, no ischemia or infarction. 8. Dyspnea with exertion: echo (11/10): EF 55-60%, no regional wall motion abnormalities, no significant valvular abnormalities.  RHC (12/10) with mean RA 6 mmHg, PA 22/10, mean PCWP , CI 2.65, SVO2 70%. PFTs (12/10) showed FEV1 63%, FVC 83%, ratio 77%, reponded to bronchodilator.  Moderate obstructive defect.  9. COPD: See above PFTs. NB  FV loop not cw copd so can't use the numbers 10.  Pseudoasthma/upper airways cough syndrome.  Improved off ACEI.   Family History: Reviewed history from 04/10/2009 and no changes required. Grandmother: CVA No premature CAD Negative for respiratory diseases or atopy   Social History: Reviewed history from 04/10/2009 and no changes required. Originally from [place].  Speaks only [place], daughter translates.  Disabled, rare ETOH.  Patient never smoked regularly  Review of Systems       All systems reviewed and negative except as per HPI.   Vital Signs:  Patient profile:   60 year old male Height:      67 inches Weight:      177 pounds BMI:     27.82 Pulse rate:   72 / minute BP sitting:   121 / 78  (left arm) Cuff size:  regular  Vitals Entered By: Burnett Kanaris, CNA (June 25, 2009 2:23 PM)  Physical Exam  General:  Well developed, well nourished, in no acute distress. Neck:  Neck supple, no JVD. No masses, thyromegaly or abnormal cervical nodes. Lungs:  Clear bilaterally to auscultation and percussion. Heart:  Non-displaced PMI, chest non-tender; regular rate and rhythm, S1, S2 without murmurs, rubs or gallops. Carotid upstroke normal, no bruit. Pedals normal pulses. No edema, no varicosities. Abdomen:  Bowel sounds positive; abdomen soft and non-tender without masses, organomegaly, or hernias noted. No  hepatosplenomegaly. Extremities:  No clubbing or cyanosis. Neurologic:  Alert and oriented x 3. Psych:  Normal affect.   Impression & Recommendations:  Problem # 1:  SHORTNESS OF BREATH (ICD-786.05) Cardiac workup was unremarkable.  There was moderate obstruction on PFTs but flow-volume loop not consistent with COPD.  He saw Dr. Sherene Sires who suspected pseudoasthma/upper airways cough syndrome.  He is doing better off ACEI and on PPI.  Continue current management. He can see me back in this office as needed.   Problem # 2:  HYPERTENSION, UNSPECIFIED (ICD-401.9) BP is reasonably well-controlled.    Problem # 3:  HYPERLIPIDEMIA-MIXED (ICD-272.4) Repeat LFTs today on lower dose of simvastatin.  If still elevated, stop simvastatin completely and consider a different statin after an appropriate interval.  He is on Lovaza for elevated triglycerides.    Other Orders: TLB-Hepatic/Liver Function Pnl (80076-HEPATIC)  Patient Instructions: 1)  Your physician recommends that you have lab today--liver profile--786.09 2)  Your physician recommends that you return for a FASTING lipid profile/liver profile in 1 month---786.09 3)  Your physician recommends that you schedule a follow-up appointment as needed with Dr Shirlee Latch.

## 2010-04-08 NOTE — Letter (Signed)
Summary: Diabetic Instructions  [facility]  [address] [place], [place] [address]   Phone: [phone]  Fax: [phone]    Jesus [name] 09-Oct-1950 MRN: [id number]   X   ORAL DIABETIC MEDICATION INSTRUCTIONS  The day before your procedure:   Take your diabetic pill as you do normally  The day of your procedure:   Do not take your diabetic pill    We will check your blood sugar levels during the admission process and again in Recovery before discharging you home  ________________________________________________________________________

## 2010-04-08 NOTE — Procedures (Signed)
Summary: Upper Endoscopy  Patient: Mitchael Congo Note: All result statuses are Final unless otherwise noted.  Tests: (1) Upper Endoscopy (EGD)   EGD Upper Endoscopy       DONE     [facility]     [address]     [place], [place]  [address]           ENDOSCOPY PROCEDURE REPORT           PATIENT:  Congo, Korvin  MR#:  [id number]     BIRTHDATE:  1950/07/11, 59 yrs. old  GENDER:  male     ENDOSCOPIST:  Rachael Fee, MD     PROCEDURE DATE:  10/11/2009     PROCEDURE:  EGD with biopsy     ASA CLASS:  Class II     INDICATIONS:  dyspepsia     MEDICATIONS:  Versed 2 mg IV, There was residual sedation effect     present from prior procedure.     TOPICAL ANESTHETIC:  Exactacain Spray           DESCRIPTION OF PROCEDURE:   After the risks benefits and     alternatives of the procedure were thoroughly explained, informed     consent was obtained.  The LB [id number] endoscope was     introduced through the mouth and advanced to the second portion of     the duodenum, without limitations.  The instrument was slowly     withdrawn as the mucosa was fully examined.     <<PROCEDUREIMAGES>>           There was moderate, non-specific gastritis. Biopsies were taken to     check for H. pylori (pathology jar 3). The duodenum appeared     normal, biopsied to check for sprue given elevated serologies (see     image1, image2, image3, and image5).    Retroflexed views revealed     no abnormalities.    The scope was then withdrawn from the patient     and the procedure completed.           COMPLICATIONS:  None           ENDOSCOPIC IMPRESSION:     1) Moderate gastritis (biopsied to check for H. pylori). Normal     duodenum, biopsied to check for sprue.           RECOMMENDATIONS:     Await pathology results.           ______________________________     Rachael Fee, MD           n.     eSIGNED:   Rachael Fee at 10/11/2009 03:27 PM           Congo, Quantel,  [id number]  Note: An exclamation mark (!) indicates a result that was not dispersed into the flowsheet. Document Creation Date: 10/11/2009 3:28 PM _______________________________________________________________________  (1) Order result status: Final Collection or observation date-time: 10/11/2009 15:19 Requested date-time:  Receipt date-time:  Reported date-time:  Referring Physician:   Ordering Physician: Rob Bunting [phone]) Specimen Source:  Source: Launa Grill Order Number: [id number] Lab site:

## 2010-04-08 NOTE — Progress Notes (Signed)
Summary: having stomach pains from medication  Phone Note Call from Patient Call back at Home Phone [phone]   Caller: Patient Summary of Call: Pt having stomach pains with taking Pravasdatin 40mg  Initial call taken by: Judie Grieve,  March 12, 2009 1:09 PM  Follow-up for Phone Call        talked with patient--pt states since starting Pravastatin he has had stomach pain--he wants to change to another medication for his cholesterol--reviewed with Dr Milda Smart stop Pravastatin 40mg  and start Zocor 20mg  daily--pt will return for L/L profile 04/24/09-pt will also start Fish Oil 2000mg  daily    New/Updated Medications: ZOCOR 20 MG TABS (SIMVASTATIN) one in the evening FISH OIL 1000 MG CAPS (OMEGA-3 FATTY ACIDS) take 2 capsules daily Prescriptions: ZOCOR 20 MG TABS (SIMVASTATIN) one in the evening  #30 x 2   Entered by:   Katina Dung, RN, BSN   Authorized by:   Marca Ancona, MD   Signed by:   Katina Dung, RN, BSN on 03/12/2009   Method used:   Electronically to        [facility]. [phone]* (retail)       [address]       [place], [place]  [address]       Ph: [phone]       Fax: [phone]   RxID:   [id number]    Current Medications (verified): 1)  Glipizide 2.5 Mg Xr24h-Tab (Glipizide) .Marland Kitchen.. 1 Tab Once Daily 2)  Omeprazole 20 Mg Cpdr (Omeprazole) .Marland Kitchen.. 1 Tab Two Times A Day 3)  Nabumetone 500 Mg Tabs (Nabumetone) .Marland Kitchen.. 1 Tab Two Times A Day 4)  Metformin Hcl 500 Mg Tabs (Metformin Hcl) .Marland Kitchen.. 1 Tab Two Times A Day 5)  Aspirin 81 Mg Tbec (Aspirin) .... Take One Tablet By Mouth Daily 6)  Tricor 48 Mg Tabs (Fenofibrate) .Marland Kitchen.. 1 Tablet Dailty 7)  Zocor 20 Mg Tabs (Simvastatin) .... One in The Evening 8)  Lisinopril 10 Mg Tabs (Lisinopril) .... One Tablet Daily 9)  Combivent 103-18 Mcg/act Aero (Ipratropium-Albuterol) .Marland Kitchen.. 1 Puff Four Times A Day 10)  Fish Oil 1000 Mg Caps (Omega-3 Fatty Acids) .... Take 2 Capsules  Daily  Allergies: No Known Drug Allergies

## 2010-04-08 NOTE — Procedures (Signed)
Summary: Colonoscopy  Patient: Jesus [name] Note: All result statuses are Final unless otherwise noted.  Tests: (1) Colonoscopy (COL)   COL Colonoscopy           DONE     [facility]     [address]     [place], [place]  [address]           COLONOSCOPY PROCEDURE REPORT     PATIENT:  [name], Jesus [name]  MR#:  [id number]     BIRTHDATE:  11/26/1950, 59 yrs. old  GENDER:  male     ENDOSCOPIST:  Rachael Fee, MD     PROCEDURE DATE:  10/11/2009     PROCEDURE:  Colonoscopy with snare polypectomy     ASA CLASS:  Class II     INDICATIONS:  rectal bleeding     MEDICATIONS:   Fentanyl 50 mcg IV, Versed 5 mg IV     DESCRIPTION OF PROCEDURE:   After the risks benefits and     alternatives of the procedure were thoroughly explained, informed     consent was obtained.  No rectal exam performed. The LB [id number]     [id number] endoscope was introduced through the anus and advanced to     the cecum, which was identified by both the appendix and ileocecal     valve, without limitations.  The quality of the prep was good,     using MoviPrep.  The instrument was then slowly withdrawn as the     colon was fully examined.     <<PROCEDUREIMAGES>>     FINDINGS:Seven polyps were found. All were removed. These were all     sessile, ranged in size from 4mm to 15mm. Five of them required     cold snare, the largest two required snare with cautery (located     in distal ascending colon). The polyps were in asending colon,     sigmoid colon segments (see image5, image6, image7, image8, and     image9).  6 of the polyps were retreived, sent to pathology (jar     1).  Mild diverticulosis was found in the sigmoid to descending     colon segments (see image1).  This was otherwise a normal     examination of the colon (see image2, image3, and image10).     Retroflexed views in the rectum revealed no abnormalities.    The     scope was then withdrawn from the patient and the procedure  completed.           COMPLICATIONS:  None           ENDOSCOPIC IMPRESSION:     1) Seven polyps removed, six were retrieved and sent to     pathology     2) Mild diverticulosis in the sigmoid to descending colon     segments     3) Otherwise normal examination           RECOMMENDATIONS:     1) If the polyp(s) removed today are proven to be adenomatous     (pre-cancerous) polyps, you will need a colonoscopy in 3 years.     Otherwise you should continue to follow colorectal cancer     screening guidelines for "routine risk" patients with a     colonoscopy in 10 years.     2) You will receive a letter within 1-2 weeks with the results     of your biopsy as well as final  recommendations. Please call my     office if you have not received a letter after 3 weeks.           ______________________________     Rachael Fee, MD           n.     eSIGNED:   Rachael Fee at 10/11/2009 03:12 PM           [name], [name], [id number]  Note: An exclamation mark (!) indicates a result that was not dispersed into the flowsheet. Document Creation Date: 10/11/2009 3:14 PM _______________________________________________________________________  (1) Order result status: Final Collection or observation date-time: 10/11/2009 15:08 Requested date-time:  Receipt date-time:  Reported date-time:  Referring Physician:   Ordering Physician: Rob Bunting [phone]) Specimen Source:  Source: Launa Grill Order Number: [id number] Lab site:   Appended Document: Colonoscopy recall     Procedures Next Due Date:    Colonoscopy: 10/2012

## 2010-04-08 NOTE — Assessment & Plan Note (Signed)
Summary: Pulmonary/ new pt eval ? all ace     Copy to:  Dr. [facility] Circle Primary Provider/Referring Provider:  Tyler Deis Id-Din  CC:  Pt here for pulmonary consult. Pt c/o S.O.B x 15 years. Pt states has tried Combivent w/o any relief and has d/c same.  History of Present Illness: 60 yo [place] minimal smoking hx  doe x 1995 with new onset progressive much worse  doe starting 01/2009  April 10, 2009 initial pulmonary eval c/o S.O.B x 15 years much worse with dry cough since 01/2009 indolent onset and progressive  Pt states has tried Combivent w/o any relief and has d/c same. now has sob sitting and sleeping assoc with hoarseness and severe HB.   Rec change ace to arb by [facility] but insurance woudn't pay for it so still on ace.   Pt denies any significant sore throat, dysphagia, itching, sneezing,  nasal congestion or excess secretions,  fever, chills, sweats, unintended wt loss, pleuritic or exertional cp, hempoptysis, change in activity tolerance  orthopnea pnd or leg swelling Pt also denies any obvious fluctuation in symptoms with weather or environmental change or other alleviating or aggravating factors.       Preventive Screening-Counseling & Management  Alcohol-Tobacco     Smoking Status: never  Current Medications (verified): 1)  Glipizide 2.5 Mg Xr24h-Tab (Glipizide) .Marland Kitchen.. 1 Tab Once Daily 2)  Omeprazole 20 Mg Cpdr (Omeprazole) .Marland Kitchen.. 1 Tab Two Times A Day 3)  Nabumetone 500 Mg Tabs (Nabumetone) .Marland Kitchen.. 1 Tab Two Times A Day 4)  Metformin Hcl 500 Mg Tabs (Metformin Hcl) .Marland Kitchen.. 1 Tab Two Times A Day 5)  Zocor 20 Mg Tabs (Simvastatin) .... One in The Evening 6)  Lisinopril 10 Mg Tabs (Lisinopril) .... One Tablet Daily 7)  Lexapro 10 Mg Tabs (Escitalopram Oxalate) .... Take One Tab At Bedtime 8)  Viagra 25 Mg Tabs (Sildenafil Citrate) .... Take One As Needed Prior To Sexual Activity--Do Not Use More Than One Per Day--Do Not Use With Nitroglycerine 9)  Aspir-Low 81 Mg Tbec (Aspirin) .... One  Tablet Daily  Allergies (verified): No Known Drug Allergies  Past History:  Past Medical History: 1. GERD 2. DM2 3. HTN  ACE d/c April 10, 2009 due to suspicion of pseudoasthma 4. Hyperlipidemia 5. Depression 6. Arthritis 7. Chest pain: Lexiscan myoview (11/10): EF 69%, no ischemia or infarction. 8. Dyspnea with exertion: echo (11/10): EF 55-60%, no regional wall motion abnormalities, no significant valvular abnormalities.  RHC (12/10) with mean RA 6 mmHg, PA 22/10, mean PCWP , CI 2.65, SVO2 70%. PFTs (12/10) showed FEV1 63%, FVC 83%, ratio 77%, reponded to bronchodilator.  Moderate obstructive defect.  9. COPD: See above PFTs. NB  FV loop not cw copd so can't use the numbers  Family History: Grandmother: CVA No premature CAD Negative for respiratory diseases or atopy   Social History: Originally from [place].  Speaks only [place], daughter translates.  Disabled, rare ETOH.  Patient never smoked regularly Smoking Status:  never  Review of Systems       The patient complains of shortness of breath with activity, shortness of breath at rest, and acid heartburn.  The patient denies productive cough, non-productive cough, coughing up blood, chest pain, irregular heartbeats, indigestion, loss of appetite, weight change, abdominal pain, difficulty swallowing, sore throat, tooth/dental problems, headaches, nasal congestion/difficulty breathing through nose, sneezing, itching, ear ache, anxiety, depression, hand/feet swelling, joint stiffness or pain, rash, change in color of mucus, and fever.  Vital Signs:  Patient profile:   60 year old male Height:      67 inches Weight:      181.38 pounds O2 Sat:      97 % on Room air Temp:     98.0 degrees F oral Pulse rate:   96 / minute BP sitting:   108 / 68  (left arm) Cuff size:   regular  Vitals Entered By: Zackery Barefoot CMA (April 10, 2009 10:24 AM)  O2 Flow:  Room air CC: Pt here for pulmonary consult. Pt c/o S.O.B  x 15 years. Pt states has tried Combivent w/o any relief and has d/c same Comments Medications reviewed with patient Verified pt's contact number Zackery Barefoot CMA  April 10, 2009 10:24 AM    Physical Exam  Additional Exam:  hoarse amb male nad HEENT: nl dentition, turbinates, and orophanx. Nl external ear canals without cough reflex Neck without JVD/Nodes/TM Lungs clear to A and P bilaterally without cough on insp or exp maneuvers RRR no s3 or murmur or increase in P2 Abd soft and benign with nl excursion in the supine position. No bruits or organomegaly Ext warm without calf tenderness, cyanosis clubbing or edema Skin warm and dry without lesions     CXR  Procedure date:  04/10/2009  Findings:      Comparison: None   Findings: Moderate thoracic spondylosis. Midline trachea.  Normal heart size and mediastinal contours. No pleural effusion or pneumothorax.  Lung volumes are low.  Mild left greater than right bibasilar atelectasis.   IMPRESSION: Low lung volumes with mild bibasilar atelectasis. No acute cardiopulmonary disease.  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  These symptoms are easily confused with asthma/copd by even experienced pulmonogists because they overlap so much. These are the same pts who not infrequently have failed to tolerate ace inhibitors,  dry powder inhalers or biphosphonates or report having reflux symptoms that don't respond to standard doses of PPI,  2/4 applying here clearly (on ace with GERD  on PPI two times a day)  Rec off ace x 1 month, use combivent as needed and return if not 100% satisfied, explained thru interpreter  I spent extra time with the patient today explaining optimal mdi  technique.  This improved from  25-75%  Problem # 2:  HYPERTENSION, UNSPECIFIED (ICD-401.9)  The following medications were removed from the medication list:    Lisinopril 10 Mg Tabs (Lisinopril) ..... One tablet daily    Benicar 5 Mg Tabs (Olmesartan medoxomil) .Marland Kitchen..Marland Kitchen Two tablets daily His updated medication list for this problem includes:    Diovan 80 Mg Tabs (Valsartan) ..... One daily  ACE inhibitors are problematic in  pts with airway complaints because  even experienced pulmononlogists can't always distinguish ace effects from copd/asthma.  By themselves they don't actually cause a problem, much like oxygen can't by itself start a fire, but they certainly serve as a powerful catalyst or enhancer for any "fire"  or inflammatory process in the upper airway, be it caused by an ET  tube or more commonly  reflux (especially in the obese or pts with known GERD or who are on biphoshonates).  In the era of ARB near equivalency until we have a better handle on the reversibility of the airway problem, it just makes sense to avoid ace entirely in the short run and then decide later, having established a level of airway control using a reasonable limited regimen, whether to add back ace but even then being very careful to observe the pt for worsening airway control and number of meds used/ needed to control symptoms.    Orders: New Patient Level V (86578)  Medications Added to Medication List This Visit: 1)  Omeprazole 20 Mg Cpdr (Omeprazole) .... Take one 30-60 min before first and last meals of the day 2)  Diovan 80 Mg Tabs (Valsartan) .... One daily  Other Orders: T-2 View CXR (71020TC)  Patient Instructions: 1)  Stop the lisnopril 2)  Diovan 80  one daily in place of lisinopril 3)  change  omeprazole 20mg  Take one 30-60 min before first and last meals of the day 4)  GERD (REFLUX)  is a common cause of respiratory symptoms. It commonly presents without heartburn and can be treated with medication, but also with lifestyle changes including avoidance of late meals, excessive alcohol, smoking cessation, and avoid fatty foods, chocolate, peppermint, colas, red wine, and acidic juices such as orange juice. NO MINT OR MENTHOL PRODUCTS SO NO COUGH DROPS  5)  USE SUGARLESS CANDY INSTEAD (jolley ranchers)  6)  NO OIL BASED VITAMINS  7)  use combivent hfa 2 puffs every 4 hours if needed 8)  if better return to [facility] for best option to treat blood pressure (or Dr Jearld Pies)   Immunization History:  Influenza Immunization History:    Influenza:  historical (01/07/2009)

## 2010-04-08 NOTE — Progress Notes (Signed)
Summary: Appt. ASAP  Phone Note From Other Clinic   Caller: [facility] @ [facility]   [phone] Call For: Dr. Leone Payor Summary of Call: Pt. has Elevated LFT's...wants pt. seen ASAP Initial call taken by: Karna Christmas,  Jul 12, 2009 3:21 PM  Follow-up for Phone Call        patient will come and see Willette Cluster RNP 07/16/09 1:30 Follow-up by: Darcey Nora RN, CGRN,  Jul 12, 2009 3:48 PM

## 2010-04-08 NOTE — Assessment & Plan Note (Signed)
Summary: per check out/sf/pt speaks spanish will have interperter per ...   Referring Provider:  Nolon Nations Nur-id-Din Primary Provider:  Tyler Deis Id-Din   History of Present Illness: 60 yo with history of DM2, HTN, and hyperlipidemia returns for evaluation of dyspnea.  He gets short of breath after walking 1-2 blocks if he walks fast.  He is short of breath climbing a flight of stairs.  He has not smoked for 25 years.  Echo showed normal EF and no significant valvular abnormality.  Myoview was negative.  Right heart cath was eventually done showing normal right and left heart filling pressures.  Finally, PFTs showed a moderate defect that was reversible with bronchodilation.   Labs (11/10): TG 172, HDL 35, LDL 121, ALT 66, AST 37 Labs (12/10): HDL 36, LDL 81  ECG: normal  Current Medications (verified): 1)  Glipizide 2.5 Mg Xr24h-Tab (Glipizide) .Marland Kitchen.. 1 Tab Once Daily 2)  Omeprazole 20 Mg Cpdr (Omeprazole) .Marland Kitchen.. 1 Tab Two Times A Day 3)  Nabumetone 500 Mg Tabs (Nabumetone) .Marland Kitchen.. 1 Tab Two Times A Day 4)  Metformin Hcl 500 Mg Tabs (Metformin Hcl) .Marland Kitchen.. 1 Tab Two Times A Day 5)  Zocor 20 Mg Tabs (Simvastatin) .... One in The Evening 6)  Lisinopril 10 Mg Tabs (Lisinopril) .... One Tablet Daily 7)  Combivent 103-18 Mcg/act Aero (Ipratropium-Albuterol) .Marland Kitchen.. 1 Puff Four Times A Day 8)  Lexapro 10 Mg Tabs (Escitalopram Oxalate) .... Take One Tab At Bedtime 9)  Benicar 5 Mg Tabs (Olmesartan Medoxomil) .... Two Tablets Daily 10)  Viagra 25 Mg Tabs (Sildenafil Citrate) .... Take One As Needed Prior To Sexual Activity--Do Not Use More Than One Per Day--Do Not Use With Nitroglycerine 11)  Aspir-Low 81 Mg Tbec (Aspirin) .... One Tablet Daily  Allergies (verified): No Known Drug Allergies  Past History:  Past Medical History: 1. GERD 2. DM2 3. HTN 4. Hyperlipidemia 5. Depression 6. Arthritis 7. Chest pain: Lexiscan myoview (11/10): EF 69%, no ischemia or infarction. 8. Dyspnea with exertion: echo  (11/10): EF 55-60%, no regional wall motion abnormalities, no significant valvular abnormalities.  RHC (12/10) with mean RA 6 mmHg, PA 22/10, mean PCWP , CI 2.65, SVO2 70%. PFTs (12/10) showed FEV1 63%, FVC 83%, ratio 77%, reponded to bronchodilator.  Moderate obstructive defect.  9. COPD: See above PFTs.   Family History: Reviewed history from 01/11/2009 and no changes required. Grandmother: CVA No premature CAD  Social History: Reviewed history from 01/11/2009 and no changes required. Originally from [place].  Speaks only [place], daughter translates.  Disabled.  Nonsmoker, rare ETOH.   Review of Systems       All systems reviewed and negative except as per HPI.   Vital Signs:  Patient profile:   60 year old male Height:      67 inches Weight:      176 pounds BMI:     27.67 Pulse rate:   72 / minute BP sitting:   116 / 74  (left arm) Cuff size:   large  Vitals Entered By: Oswald Hillock (April 02, 2009 11:57 AM)  Physical Exam  General:  Well developed, well nourished, in no acute distress. Neck:  Neck supple, no JVD. No masses, thyromegaly or abnormal cervical nodes. Lungs:  Clear bilaterally to auscultation and percussion. Heart:  Non-displaced PMI, chest non-tender; regular rate and rhythm, S1, S2 without murmurs, rubs or gallops. Carotid upstroke normal, no bruit. Pedals normal pulses. No edema, no varicosities. Abdomen:  Bowel sounds positive; abdomen soft and  non-tender without masses, organomegaly, or hernias noted. No hepatosplenomegaly. Extremities:  No clubbing or cyanosis. Neurologic:  Alert and oriented x 3. Psych:  Normal affect.   Impression & Recommendations:  Problem # 1:  SHORTNESS OF BREATH (ICD-786.05) This symptom continues unabated.  RHC showed normal LV and RV filling pressure at baseline.   Echo and myoview were unremarkable.  PFTs showed a moderate obstructive defect with significant response to bronchodilators.  I tried him on  Combivent but he says it has not made much difference.   I wonder if there could be an upper airways component to his symptoms.  I will have him stop lisinopril and start Benicar 10 mg daily instead.  I will have him followup with pulmonology.  Problem # 2:  ERECTILE DYSFUNCTION Patient has been having erectile trouble and requested a prescription for Viagra.  He is not on nitrates.  I gave him the prescription.   Other Orders: Pulmonary Referral (Pulmonary)  Patient Instructions: 1)  Your physician has recommended you make the following change in your medication:  2)  Decrease Aspirin to 81mg  daily 3)  Stop Lisinopril 4)  Start Benicar 10mg  daily--this will be two 5mg  tablets daily 5)  Use Viagra as needed--DO NOT more  than one per day---DO NOT use with nitroglycerin 6)  You have been referred to pulmonary to follow-up on your shortness of breath 7)  Your physician recommends that you return for a FASTING lipid profile/liver profile./BMP in 2 months 786.09 8)  Your physician recommends that you schedule a follow-up appointment in: 3 months with Dr. Marca Ancona    Prescriptions: VIAGRA 25 MG TABS (SILDENAFIL CITRATE) take one as needed prior to sexual activity--do not use more than one per day--do not use with Nitroglycerine  #15 x 1   Entered by:   Katina Dung, RN, BSN   Authorized by:   Marca Ancona, MD   Signed by:   Katina Dung, RN, BSN on 04/02/2009   Method used:   Electronically to        [facility] [phone]* (retail)       [address]       [place], [place]  [address]       Ph: [phone]       Fax: [phone]   RxID:   [id number] BENICAR 5 MG TABS (OLMESARTAN MEDOXOMIL) two tablets daily  #60 x 6   Entered by:   Katina Dung, RN, BSN   Authorized by:   Marca Ancona, MD   Signed by:   Katina Dung, RN, BSN on 04/02/2009   Method used:   Electronically to        [facility] [phone]* (retail)       [address]       [place], [place]  [address]       Ph: [phone]       Fax: [phone]   RxID:   [id number]

## 2010-04-08 NOTE — Assessment & Plan Note (Signed)
  Review of gastrointestinal problems: 1. Elevated liver tests: noted first in 2008 or 2009 with very mild elevation in transaminases. 2011: transaminases both 100-200 range, perhaps from a new medicine salsalate. Laboratory workup may 2011: hepatitis B negative, hepatitis C negative, ANA negative, ceruloplasmin normal, iron studies normal, Alpha one antitrypsin levels normal, AMA normal, anti-smooth muscle antibody normal.  May 2011 abdominal ultrasound showed echogenic liver, perhaps fatty liver, could not rule out underlying cirrhosis.  October, 2011: liver tests all normal.  TTG was elevated however biopsies of duodenum were normal, no sprue. 2. Multiple precancerous colon polyps on colonoscopy 2011: recall set at 3 year interval 3. H. pylori positive gastritis, EGD 2011, treated with Prevpac    History of Present Illness Visit Type: Follow-up Visit Primary GI MD: Rob Bunting MD Primary Juelz Cla[facility] Requesting Nahdia Doucet: na Chief Complaint: F/u visit  History of Present Illness:   very pleasant man whom I last saw at the time of his colonoscopy, upper endoscopy.see those results above. He is year with an interpreter since he speaks only [place].   took prev pac for a week, cannot recall any side effects.    He feels better overall.  The pain is much decreased.  dNO nausea or vomiting.  Feel bien.             Current Medications (verified): 1)  Omeprazole 20 Mg Cpdr (Omeprazole) .... Take One 30-60 Min Before First and Last Meals of The Day 2)  Metformin Hcl 500 Mg Tabs (Metformin Hcl) .Marland Kitchen.. 1 Tab Two Times A Day 3)  Lexapro 10 Mg Tabs (Escitalopram Oxalate) .... Take One Tab At Bedtime 4)  Aspir-Low 81 Mg Tbec (Aspirin) .... One Tablet Daily 5)  Diovan 80 Mg Tabs (Valsartan) .... One Daily 6)  Lovaza 1 Gm Caps (Omega-3-Acid Ethyl Esters) .... Two Capsules Twice A Day 7)  Vitamin B-1 250 Mg Tabs (Thiamine Hcl) .... Take One By Mouth Once  Daily 8)  Tramadol Hcl 50 Mg Tabs (Tramadol Hcl) .... Take One By Mouth Every 6 Hours As Needed  Allergies (verified): No Known Drug Allergies  Vital Signs:  Patient profile:   60 year old male Height:      67 inches Weight:      167 pounds BMI:     26.25 BSA:     1.87 Pulse rate:   88 / minute Pulse rhythm:   regular BP sitting:   116 / 62  (left arm) Cuff size:   regular  Vitals Entered By: Ok Anis CMA (December 10, 2009 9:06 AM)  Physical Exam  Additional Exam:  Constitutional: generally well appearing Psychiatric: alert and oriented times 3 Abdomen: soft, non-tender, non-distended, normal bowel sounds    Impression & Recommendations:  Problem # 1:  H. pylori positive gastritis, dyspepsia, improved he knows to call for has a return of these symptoms.    Problem # 2:  precancerous colon polyps recall colonoscopy in about 3 years  Problem # 3:  abnormal liver tests see the workup above, lab tests yesterday were completely normal.  Patient Instructions: 1)  Next colonoscopy will be in 3 years. 2)  Call Dr. Christella Hartigan if any new GI issues. 3)  The medication list was reviewed and reconciled.  All changed / newly prescribed medications were explained.  A complete medication list was provided to the patient / caregiver.

## 2010-04-08 NOTE — Letter (Signed)
Summary: [facility] Instructions  [facility]  [address] [place], [place] [address]   Phone: [phone]  Fax: [phone]       Jesus [name]    21-Aug-1950    MRN: [id number]        Procedure Day /Date:10/11/09  FRI     Arrival Time:130 pm     Procedure Time:230 pm     Location of Procedure:                    X  [facility] (4th Floor)                        PREPARATION FOR COLONOSCOPY WITH MOVIPREP   Starting 5 days prior to your procedure 10/07/09 do not eat nuts, seeds, popcorn, corn, beans, peas,  salads, or any raw vegetables.  Do not take any fiber supplements (e.g. Metamucil, Citrucel, and Benefiber).  THE DAY BEFORE YOUR PROCEDURE         DATE: 10/10/09  DAY: THURS  1.  Drink clear liquids the entire day-NO SOLID FOOD  2.  Do not drink anything colored red or purple.  Avoid juices with pulp.  No orange juice.  3.  Drink at least 64 oz. (8 glasses) of fluid/clear liquids during the day to prevent dehydration and help the prep work efficiently.  CLEAR LIQUIDS INCLUDE: Water Jello Ice Popsicles Tea (sugar ok, no milk/cream) Powdered fruit flavored drinks Coffee (sugar ok, no milk/cream) Gatorade Juice: apple, white grape, white cranberry  Lemonade Clear bullion, consomm, broth Carbonated beverages (any kind) Strained chicken noodle soup Hard Candy                             4.  In the morning, mix first dose of MoviPrep solution:    Empty 1 Pouch A and 1 Pouch B into the disposable container    Add lukewarm drinking water to the top line of the container. Mix to dissolve    Refrigerate (mixed solution should be used within 24 hrs)  5.  Begin drinking the prep at 5:00 p.m. The MoviPrep container is divided by 4 marks.   Every 15 minutes drink the solution down to the next mark (approximately 8 oz) until the full liter is complete.   6.  Follow completed prep with 16 oz of clear liquid of your choice (Nothing red or  purple).  Continue to drink clear liquids until bedtime.  7.  Before going to bed, mix second dose of MoviPrep solution:    Empty 1 Pouch A and 1 Pouch B into the disposable container    Add lukewarm drinking water to the top line of the container. Mix to dissolve    Refrigerate  THE DAY OF YOUR PROCEDURE      DATE:10/11/09 DAY: FRI  Beginning at 930 a.m. (5 hours before procedure):         1. Every 15 minutes, drink the solution down to the next mark (approx 8 oz) until the full liter is complete.  2. Follow completed prep with 16 oz. of clear liquid of your choice.    3. You may drink clear liquids until 1230 pm (2 HOURS BEFORE PROCEDURE).   MEDICATION INSTRUCTIONS  Unless otherwise instructed, you should take regular prescription medications with a small sip of water   as early as possible the morning of your procedure.  Diabetic  patients - see separate instructions.           OTHER INSTRUCTIONS  You will need a responsible adult at least 59 years of age to accompany you and drive you home.   This person must remain in the waiting room during your procedure.  Wear loose fitting clothing that is easily removed.  Leave jewelry and other valuables at home.  However, you may wish to bring a book to read or  an iPod/MP3 player to listen to music as you wait for your procedure to start.  Remove all body piercing jewelry and leave at home.  Total time from sign-in until discharge is approximately 2-3 hours.  You should go home directly after your procedure and rest.  You can resume normal activities the  day after your procedure.  The day of your procedure you should not:   Drive   Make legal decisions   Operate machinery   Drink alcohol   Return to work  You will receive specific instructions about eating, activities and medications before you leave.    The above instructions have been reviewed and explained to me by   _______________________    I  fully understand and can verbalize these instructions _____________________________ Date _________

## 2010-04-08 NOTE — Progress Notes (Signed)
Summary: triage  Phone Note Call from Patient Call back at Home Phone [phone]   Caller: Patient Call For: Christella Hartigan Reason for Call: Talk to Nurse Summary of Call: Patient had side effect from Prev Pac states that he's constipated and has severe stomach pain right after taking them.  Initial call taken by: Tawni Levy,  October 21, 2009 10:31 AM  Follow-up for Phone Call        pt was intructed [facility] call [facility].  They are the original prescriber.  Pt will call [facility]. Follow-up by: Chales Abrahams CMA Duncan Dull),  October 21, 2009 10:38 AM

## 2010-04-08 NOTE — Progress Notes (Signed)
Summary: Appt change to [facility] FOR [place] DUE TO INS COVERAGE  Phone Note Outgoing Call   Call placed by: Joselyn Glassman,  Jul 17, 2009 3:26 PM Call placed to: interpreter Summary of Call: Per [place] Mole, due to the pt's Medicaid plan , he cannot have US done at [facility].  He has to go to [facility] so I made appt for him at [address]  for 07-19-09 and he is to arrive at 10:15Am.  He is to be NPO after midnight.  I spoke to [place] at the Interpreter's office at [phone].  She will call him to let him know of the change and arrange for interpreter for him for the 13th.  Initial call taken by: Joselyn Glassman,  Jul 17, 2009 3:30 PM

## 2010-04-08 NOTE — Assessment & Plan Note (Signed)
Review of gastrointestinal problems: 1. Elevated liver tests: noted first in 2008 or 2009 with very mild elevation in transaminases. 2011: transaminases both 100-200 range, perhaps from a new medicine salsalate. Laboratory workup may 2011: hepatitis B negative, hepatitis C negative, ANA negative, ceruloplasmin normal, iron studies normal, Alpha one antitrypsin levels normal, AMA normal, anti-smooth muscle antibody normal.  May 2011 abdominal ultrasound showed echogenic liver, perhaps fatty liver, could not rule out underlying cirrhosis.    History of Present Illness Visit Type: Follow-up Visit Primary GI MD: Rob Bunting MD Primary Provider: Tyler Deis Id-Din Requesting Provider: na Chief Complaint: Follow up History of Present Illness:     very pleasant 60 year old man who feels overall OK.  Sometimes sees blood in his stools.  Sometimes constiopation, diarrhea.  he did not arrive with an interpreter and so communication was not ideal however I think we got by pretty well. He speaks English somewhat.  his last set of liver tests showed AST was 58, ALT was 113. These are lower than recently but has been coming down very slowly.           Current Medications (verified): 1)  Omeprazole 20 Mg Cpdr (Omeprazole) .... Take One 30-60 Min Before First and Last Meals of The Day 2)  Metformin Hcl 500 Mg Tabs (Metformin Hcl) .Marland Kitchen.. 1 Tab Two Times A Day 3)  Lexapro 10 Mg Tabs (Escitalopram Oxalate) .... Take One Tab At Bedtime 4)  Aspir-Low 81 Mg Tbec (Aspirin) .... One Tablet Daily 5)  Diovan 80 Mg Tabs (Valsartan) .... One Daily 6)  Lovaza 1 Gm Caps (Omega-3-Acid Ethyl Esters) .... Two Capsules Twice A Day 7)  Vitamin B-1 250 Mg Tabs (Thiamine Hcl) .... Take One By Mouth Once Daily 8)  Tramadol Hcl 50 Mg Tabs (Tramadol Hcl) .... Take One By Mouth Every 6 Hours As Needed  Allergies (verified): No Known Drug Allergies  Vital Signs:  Patient profile:   60 year old male Height:      67  inches Weight:      168.0 pounds BMI:     26.41 Pulse rate:   80 / minute Pulse rhythm:   regular BP sitting:   100 / 64  (left arm) Cuff size:   regular  Vitals Entered By: Harlow Mares CMA Duncan Dull) (October 01, 2009 10:17 AM)  Physical Exam  Additional Exam:  Constitutional: generally well appearing Psychiatric: alert and oriented times 3 Abdomen: soft, non-tender, non-distended, normal bowel sounds    Impression & Recommendations:  Problem # 1:  Minor rectal bleeding, intermittent diarrhea constipation he has never had a colonoscopy and so we will set that up at his soonest convenience.  Problem # 2:  elevated liver tests disease go as far back as 2008 or 2009. Workup to date suggests he probably has underlying fatty liver disease. He'll get a CBC, coags, celiac sprue testing. If his liver tests continue to be elevated 2-3 times normal then I think we should proceed with liver biopsy.  Other Orders: TLB-PT (Protime) (85610-PTP) TLB-CBC Platelet - w/Differential (85025-CBCD) TLB-CMP (Comprehensive Metabolic Pnl) (80053-COMP) TLB-IgA (Immunoglobulin A) (82784-IGA) T-Tissue Transglutamase Ab IgA (33295-18841)  Patient Instructions: 1)  You will get lab test(s) done today (inr, CBC, cmet, tTG, total IgA) 2)  You will be scheduled to have a colonoscopy for minor rectal bleeding. 3)  You may need a liver biopsy depending on results of bloodwork. 4)  The medication list was reviewed and reconciled.  All changed / newly prescribed medications were  explained.  A complete medication list was provided to the patient / caregiver.  Appended Document: Orders Update/movi    Clinical Lists Changes  Medications: Added new medication of MOVIPREP 100 GM  SOLR (PEG-KCL-NACL-NASULF-NA ASC-C) As per prep instructions. - Signed Rx of MOVIPREP 100 GM  SOLR (PEG-KCL-NACL-NASULF-NA ASC-C) As per prep instructions.;  #1 x 0;  Signed;  Entered by: Chales Abrahams CMA (AAMA);  Authorized by: Rachael Fee MD;  Method used: Electronically to [facility]. [phone]*, [address] [place], [place], [place]  [address], Ph: [phone], Fax: [phone] Orders: Added new Test order of Colonoscopy (Colon) - Signed    Prescriptions: MOVIPREP 100 GM  SOLR (PEG-KCL-NACL-NASULF-NA ASC-C) As per prep instructions.  #1 x 0   Entered by:   Chales Abrahams CMA (AAMA)   Authorized by:   Rachael Fee MD   Signed by:   Chales Abrahams CMA (AAMA) on 10/01/2009   Method used:   Electronically to        [facility]. [phone]* (retail)       [address]       [place], [place]  [address]       Ph: [phone]       Fax: [phone]   RxID:   [id number]    Appended Document:  interpretor sch with [employer]

## 2010-05-23 LAB — GLUCOSE, CAPILLARY
Glucose-Capillary: 101 mg/dL — ABNORMAL HIGH (ref 70–99)
Glucose-Capillary: 107 mg/dL — ABNORMAL HIGH (ref 70–99)

## 2010-06-07 ENCOUNTER — Other Ambulatory Visit: Payer: Self-pay | Admitting: *Deleted

## 2010-06-07 MED ORDER — VALSARTAN 80 MG PO TABS: 80.0000 mg | ORAL_TABLET | Freq: Every day | ORAL | Status: DC

## 2010-06-10 LAB — BASIC METABOLIC PANEL
Chloride: 102 mEq/L (ref 96–112)
Creatinine, Ser: 0.88 mg/dL (ref 0.4–1.5)
GFR calc Af Amer: >60 mL/min (ref >60)
GFR calc non Af Amer: >60 mL/min (ref >60)
Potassium: 3.7 mEq/L (ref 3.5–5.1)

## 2010-06-10 LAB — GLUCOSE, CAPILLARY: Glucose-Capillary: 123 mg/dL — ABNORMAL HIGH (ref 70–99)

## 2010-06-10 LAB — CBC
MCV: 86.8 fL (ref 78.0–100.0)
RBC: 4.85 MIL/uL (ref 4.22–5.81)
WBC: 4.8 10*3/uL (ref 4.0–10.5)

## 2010-06-10 LAB — POCT I-STAT 3, VENOUS BLOOD GAS (G3P V)
O2 Saturation: 70 %
pCO2, Ven: 48.7 mmHg (ref 45.0–50.0)
pH, Ven: 7.346 — ABNORMAL HIGH (ref 7.250–7.300)
pO2, Ven: 39 mmHg (ref 30.0–45.0)

## 2010-06-10 LAB — PROTIME-INR: Prothrombin Time: 13.6 seconds (ref 11.6–15.2)

## 2010-06-11 MED ORDER — VALSARTAN 80 MG PO TABS: 80.0000 mg | ORAL_TABLET | Freq: Every day | ORAL | Status: DC

## 2010-06-13 LAB — DIFFERENTIAL
Eosinophils Relative: 2 % (ref 0–5)
Lymphocytes Relative: 37 % (ref 12–46)
Lymphs Abs: 2.1 10*3/uL (ref 0.7–4.0)
Monocytes Absolute: 0.6 10*3/uL (ref 0.1–1.0)
Monocytes Relative: 10 % (ref 3–12)
Neutro Abs: 3 10*3/uL (ref 1.7–7.7)

## 2010-06-13 LAB — GLUCOSE, CAPILLARY

## 2010-06-13 LAB — CBC
HCT: 45.2 % (ref 39.0–52.0)
Hemoglobin: 15.5 g/dL (ref 13.0–17.0)
RBC: 5.09 MIL/uL (ref 4.22–5.81)
WBC: 5.9 10*3/uL (ref 4.0–10.5)

## 2010-06-13 LAB — BASIC METABOLIC PANEL
Calcium: 9.4 mg/dL (ref 8.4–10.5)
GFR calc Af Amer: >60 mL/min (ref >60)
GFR calc non Af Amer: >60 mL/min (ref >60)
Glucose, Bld: 115 mg/dL — ABNORMAL HIGH (ref 70–99)
Potassium: 4.4 mEq/L (ref 3.5–5.1)
Sodium: 137 mEq/L (ref 135–145)

## 2010-06-14 ENCOUNTER — Other Ambulatory Visit: Payer: Self-pay | Admitting: *Deleted

## 2010-06-14 MED ORDER — VALSARTAN 80 MG PO TABS: 80.0000 mg | ORAL_TABLET | Freq: Every day | ORAL | Status: DC

## 2010-07-22 NOTE — Procedures (Signed)
NAME:  Jesus [name], Jesus [name]    ACCOUNT NO.:  [id number]   MEDICAL RECORD NO.:  [id number]          PATIENT TYPE:  OUT   LOCATION:  SLEEP CENTER                 FACILITY:  [facility]   PHYSICIAN:  Clinton D. Maple Hudson, MD, FCCP, FACPDATE OF BIRTH:  11[birth year]01[birth year]   DATE OF STUDY:  07/27/2007                            NOCTURNAL POLYSOMNOGRAM   REFERRING PHYSICIAN:  Maurice March, M.D.   REFERRING PHYSICIAN:  Dr. Dow Adolph.   INDICATION FOR STUDY:  Hypersomnia with sleep apnea.   EPWORTH SLEEPINESS SCORE:  15/24, BMI 25.1, weight 160 pounds, height 67  inches, neck 16.5 inches.   HOME MEDICATIONS:  Chart has been reviewed.   SLEEP ARCHITECTURE:  Total sleep time 137.5 minutes with sleep  efficiency 38.1%, stage 1 18.5%, stage 2 80.7%, stage 3 0.4%, REM 0.4%  of total sleep time, sleep latency 210.5 minutes with REM latency 126.5  minutes, awake after sleep onset 8.5 minutes, arousal index 11.8.  Bedtime medication included Protonix and Celexa taken at 9 p.m.  Sleep  onset was at 2 a.m. with lights on at 4:41 a.m.   RESPIRATORY DATA:  Apnea/hypopnea index (AHI) 29.2 per hour indicating  moderate obstructive sleep apnea/hypopnea syndrome.  Sixty-seven events  were recorded including 28 obstructive apneas, 3 central apneas, and 36  hypopneas.  Events were more common while supine as expected.  REM AHI  0.   OXYGEN DATA:  Very loud snoring with oxygen desaturations with nadir of  84%, mean oxygen saturations through the study was 94% on room air.   CARDIAC DATA:  Normal sinus rhythm.   MOVEMENT-PARASOMNIA:  Limb jerks were noted, but no limb jerks were  associated with arousal or awakening.  No bathroom trips.   IMPRESSIONS-RECOMMENDATIONS:  1. Late sleep onset at 2 a.m. resulted in reduced total sleep time.  2. Moderate obstructive sleep apnea/hypopnea syndrome - apnea/hypopnea      index 29.2 per hour with most events      recorded while supine.  Sleep onset was too  late for mid CPAP      titration by split protocol on the study night.  Consider return      for CPAP titration or evaluate for alternate therapies as      appropriate.      Clinton D. Maple Hudson, MD, [facility], FACP  Diplomate, Biomedical engineer of Sleep Medicine  Electronically Signed     CDY/MEDQ  D:  08/06/2007 12:24:54  T:  08/06/2007 12:51:28  Job:  [id number]   cc:   Maurice March, M.D.  Fax: [phone]

## 2011-06-23 ENCOUNTER — Other Ambulatory Visit: Payer: Self-pay | Admitting: *Deleted

## 2011-07-01 ENCOUNTER — Other Ambulatory Visit: Payer: Self-pay | Admitting: *Deleted

## 2011-07-01 MED ORDER — VALSARTAN 80 MG PO TABS: 80.0000 mg | ORAL_TABLET | Freq: Every day | ORAL | Status: DC

## 2012-10-12 ENCOUNTER — Encounter: Payer: Self-pay | Admitting: Gastroenterology

## 2013-03-27 ENCOUNTER — Encounter: Payer: Self-pay | Admitting: Gastroenterology

## 2013-04-21 ENCOUNTER — Encounter: Payer: Self-pay | Admitting: Gastroenterology

## 2013-04-21 ENCOUNTER — Ambulatory Visit (INDEPENDENT_AMBULATORY_CARE_PROVIDER_SITE_OTHER): Payer: Medicaid Other | Admitting: Gastroenterology

## 2013-04-21 VITALS — BP 110/72 | HR 60 | Ht 67.0 in | Wt 178.4 lb

## 2013-04-21 DIAGNOSIS — K219 Gastro-esophageal reflux disease without esophagitis: Secondary | ICD-10-CM

## 2013-04-21 DIAGNOSIS — K921 Melena: Secondary | ICD-10-CM

## 2013-04-21 DIAGNOSIS — R195 Other fecal abnormalities: Secondary | ICD-10-CM

## 2013-04-21 DIAGNOSIS — Z86010 Personal history of colonic polyps: Secondary | ICD-10-CM

## 2013-04-21 MED ORDER — MOVIPREP 100 G PO SOLR: 1.0000 | Freq: Once | ORAL | Status: DC

## 2013-04-21 NOTE — Patient Instructions (Addendum)
You will be set up for a colonoscopy for polyp surveillance. You will be set up for an upper endoscopy for black stools, anemia. Please start taking omeprazole (antiacid medicine OTC) one pill once daily, shortly before breakfast meal.

## 2013-04-21 NOTE — Progress Notes (Signed)
Review of pertinent gastrointestinal problems: 1. Adenomatous polyps: Seven colon polyps (ranging in size from 4-68mm) removed during colonoscopy (Leler Brion) 2011 done for rectal bleeding.  Were TAs on pathology.  Recommended she have recall at 3 years, reminder letter was sent without response. 2. H. Pylori gastritis; EGD Ardis Hughs) 2011 done for dyspepsia; mild gastritis. Biopsies + H. Pylori.  Duodenal biopsies normal. 3. Left sided colonic diverticulosis, noted during 2011 Colonoscopy       HPI: This is a    very pleasant 63 year old man whom I last saw about 4 years ago.  He predominantly speaks [place], his English is passable but I still feel there is some language barrier here.  Hb 2014 was 12.9 Hb 03/2013 was 11.5 (microcytic, MCV 71)  Speaks spansih  Intermittent pains in his epigastrium, also lower abd pains.  Stable weight.    + naproxen daily.  + iron daily.  No overt GI red bleeding but daily BLACK stools.  No pepto  Zantac is on his list but says he doesn't take them   Review of systems: Pertinent positive and negative review of systems were noted in the above HPI section. Complete review of systems was performed and was otherwise normal.    Past Medical History  Diagnosis Date  . History of colon polyps   . Hypertension   . Diabetes     History reviewed. No pertinent past surgical history.  Current Outpatient Prescriptions  Medication Sig Dispense Refill  . aspirin 81 MG tablet Take 81 mg by mouth daily.      Marland Kitchen atenolol (TENORMIN) 25 MG tablet Take 25 mg by mouth daily.      . ferrous sulfate 325 (65 FE) MG tablet Take 325 mg by mouth daily with breakfast.      . glimepiride (AMARYL) 2 MG tablet Take 2 mg by mouth daily with breakfast.      . lisinopril (PRINIVIL,ZESTRIL) 5 MG tablet Take 5 mg by mouth daily.      Marland Kitchen loratadine (CLARITIN) 10 MG tablet Take 10 mg by mouth daily.      . Multiple Vitamins-Minerals (CENTRUM SILVER PO) Take 1 tablet by mouth  daily.      . naproxen (NAPROSYN) 500 MG tablet Take 500 mg by mouth 2 (two) times daily with a meal.      . Omega 3-6-9 Fatty Acids (OMEGA 3-6-9 COMPLEX PO) Take 1 capsule by mouth daily.      . ranitidine (ZANTAC) 300 MG capsule Take 300 mg by mouth every evening.       No current facility-administered medications for this visit.    Allergies as of 04/21/2013  . (No Known Allergies)    History reviewed. No pertinent family history.  History   Social History  . Marital Status: Single    Spouse Name: N/A    Number of Children: 4  . Years of Education: N/A   Occupational History  . Not on file.   Social History Main Topics  . Smoking status: Never Smoker   . Smokeless tobacco: Never Used  . Alcohol Use: No  . Drug Use: No  . Sexual Activity: Not on file   Other Topics Concern  . Not on file   Social History Narrative  . No narrative on file       Physical Exam: BP 110/72  Pulse 60  Ht 5\' 7"  (1.702 m)  Wt 178 lb 6.4 oz (80.922 kg)  BMI 27.93 kg/m2 Constitutional: generally well-appearing Psychiatric: alert  and oriented x3 Eyes: extraocular movements intact Mouth: oral pharynx moist, no lesions Neck: supple no lymphadenopathy Cardiovascular: heart regular rate and rhythm Lungs: clear to auscultation bilaterally Abdomen: soft, nontender, nondistended, no obvious ascites, no peritoneal signs, normal bowel sounds Extremities: no lower extremity edema bilaterally Skin: no lesions on visible extremities    Assessment and plan: 63 y.o. male with  microcytic anemia, daily nsaid use, chronic GERD, history of multiple colon polyps  First I did not mention that he has been having problems with heartburn, this is despite taking H2 blocker daily. I recommended he start taking an over-the-counter proton pump inhibitor one pill once daily. He tells me he sees black stools daily, this may be from his iron. Alternatively it might be NSAID, peptic ulcer disease related  since he does seem to take NSAIDs on a daily basis as well. Hemoglobin dropping over the past several months and are like proceed with EGD to investigate his anemia and dark stools. He is also due for polyp surveillance colonoscopy.

## 2013-05-12 ENCOUNTER — Encounter: Payer: Self-pay | Admitting: Gastroenterology

## 2013-05-12 ENCOUNTER — Ambulatory Visit (AMBULATORY_SURGERY_CENTER): Payer: Medicaid Other | Admitting: Gastroenterology

## 2013-05-12 VITALS — BP 146/91 | HR 59 | Temp 98.4°F | Resp 15 | Ht 67.0 in | Wt 178.0 lb

## 2013-05-12 DIAGNOSIS — D126 Benign neoplasm of colon, unspecified: Secondary | ICD-10-CM

## 2013-05-12 DIAGNOSIS — K2970 Gastritis, unspecified, without bleeding: Secondary | ICD-10-CM

## 2013-05-12 DIAGNOSIS — K2990 Gastroduodenitis, unspecified, without bleeding: Secondary | ICD-10-CM

## 2013-05-12 DIAGNOSIS — K219 Gastro-esophageal reflux disease without esophagitis: Secondary | ICD-10-CM

## 2013-05-12 DIAGNOSIS — Z86010 Personal history of colonic polyps: Secondary | ICD-10-CM

## 2013-05-12 LAB — GLUCOSE, CAPILLARY
GLUCOSE-CAPILLARY: 85 mg/dL (ref 70–99)
GLUCOSE-CAPILLARY: 103 mg/dL — AB (ref 70–99)

## 2013-05-12 MED ORDER — SODIUM CHLORIDE 0.9 % IV SOLN: 500.0000 mL | INTRAVENOUS | Status: DC

## 2013-05-12 NOTE — Progress Notes (Signed)
Called to room to assist during endoscopic procedure.  Patient ID and intended procedure confirmed with present staff. Received instructions for my participation in the procedure from the performing physician.  

## 2013-05-12 NOTE — Op Note (Signed)
[place]  [address] [place], [address]   COLONOSCOPY PROCEDURE REPORT  PATIENT: Jesus [name], Jesus [name]  MR#: [id number] BIRTHDATE: 04/06/1950 , 2  yrs. old GENDER: Male ENDOSCOPIST: Milus Banister, MD PROCEDURE DATE:  05/12/2013 PROCEDURE:   Colonoscopy with snare polypectomy First Screening Colonoscopy - Avg.  risk and is 50 yrs.  old or older - No.  Prior Negative Screening - Now for repeat screening. N/A  History of Adenoma - Now for follow-up colonoscopy & has been > or = to 3 yrs.  Yes hx of adenoma.  Has been 3 or more years since last colonoscopy.  Polyps Removed Today? Yes. ASA CLASS:   Class II INDICATIONS:seven polyps removed 2011, majority were adenomatous. MEDICATIONS: MAC sedation, administered by CRNA and Propofol (Diprivan) 120 mg IV  DESCRIPTION OF PROCEDURE:   After the risks benefits and alternatives of the procedure were thoroughly explained, informed consent was obtained.  A digital rectal exam revealed no abnormalities of the rectum.   The LB [id number]  endoscope was introduced through the anus and advanced to the cecum, which was identified by both the appendix and ileocecal valve. No adverse events experienced.   The quality of the prep was good.  The instrument was then slowly withdrawn as the colon was fully examined.   COLON FINDINGS: Three polyps were found, removed and all were sent to pathology.  These were all sessile, 3-33mm across, located in ascending, descending and sigmoid segments, removed with cold snare.  The examination was otherwise normal.  Retroflexed views revealed no abnormalities. The time to cecum=1 minutes 24 seconds. Withdrawal time=12 minutes 15 seconds.  The scope was withdrawn and the procedure completed. COMPLICATIONS: There were no complications.  ENDOSCOPIC IMPRESSION: Three polyps were found, removed and all were sent to pathology. The examination was otherwise normal.  RECOMMENDATIONS: If  the polyp(s) removed today are proven to be adenomatous (pre-cancerous) polyps, you will need a colonoscopy in 3-5 years. Otherwise you should continue to follow colorectal cancer screening guidelines for "routine risk" patients with a colonoscopy in 10 years.  You will receive a letter within 1-2 weeks with the results of your biopsy as well as final recommendations.  Please call my office if you have not received a letter after 3 weeks.   eSigned:  Milus Banister, MD 05/12/2013 3:35 PM

## 2013-05-12 NOTE — Progress Notes (Signed)
Procedure ends, to recovery, report given and VSS. 

## 2013-05-12 NOTE — Patient Instructions (Signed)
Discharge instructions given with verbal understanding. Handouts on polyps and gastritis. Resume previous medications. YOU HAD AN ENDOSCOPIC PROCEDURE TODAY AT THE [facility]: Refer to the procedure report that was given to you for any specific questions about what was found during the examination.  If the procedure report does not answer your questions, please call your gastroenterologist to clarify.  If you requested that your care partner not be given the details of your procedure findings, then the procedure report has been included in a sealed envelope for you to review at your convenience later.  YOU SHOULD EXPECT: Some feelings of bloating in the abdomen. Passage of more gas than usual.  Walking can help get rid of the air that was put into your GI tract during the procedure and reduce the bloating. If you had a lower endoscopy (such as a colonoscopy or flexible sigmoidoscopy) you may notice spotting of blood in your stool or on the toilet paper. If you underwent a bowel prep for your procedure, then you may not have a normal bowel movement for a few days.  DIET: Your first meal following the procedure should be a light meal and then it is ok to progress to your normal diet.  A half-sandwich or bowl of soup is an example of a good first meal.  Heavy or fried foods are harder to digest and may make you feel nauseous or bloated.  Likewise meals heavy in dairy and vegetables can cause extra gas to form and this can also increase the bloating.  Drink plenty of fluids but you should avoid alcoholic beverages for 24 hours.  ACTIVITY: Your care partner should take you home directly after the procedure.  You should plan to take it easy, moving slowly for the rest of the day.  You can resume normal activity the day after the procedure however you should NOT DRIVE or use heavy machinery for 24 hours (because of the sedation medicines used during the test).    SYMPTOMS TO REPORT IMMEDIATELY: A  gastroenterologist can be reached at any hour.  During normal business hours, 8:30 AM to 5:00 PM Monday through Friday, call [phone].  After hours and on weekends, please call the GI answering service at [phone] who will take a message and have the physician on call contact you.   Following lower endoscopy (colonoscopy or flexible sigmoidoscopy):  Excessive amounts of blood in the stool  Significant tenderness or worsening of abdominal pains  Swelling of the abdomen that is new, acute  Fever of 100F or higher  Following upper endoscopy (EGD)  Vomiting of blood or coffee ground material  New chest pain or pain under the shoulder blades  Painful or persistently difficult swallowing  New shortness of breath  Fever of 100F or higher  Black, tarry-looking stools  FOLLOW UP: If any biopsies were taken you will be contacted by phone or by letter within the next 1-3 weeks.  Call your gastroenterologist if you have not heard about the biopsies in 3 weeks.  Our staff will call the home number listed on your records the next business day following your procedure to check on you and address any questions or concerns that you may have at that time regarding the information given to you following your procedure. This is a courtesy call and so if there is no answer at the home number and we have not heard from you through the emergency physician on call, we will assume that you have   returned to your regular daily activities without incident.  SIGNATURES/CONFIDENTIALITY: You and/or your care partner have signed paperwork which will be entered into your electronic medical record.  These signatures attest to the fact that that the information above on your After Visit Summary has been reviewed and is understood.  Full responsibility of the confidentiality of this discharge information lies with you and/or your care-partner.

## 2013-05-12 NOTE — Op Note (Signed)
[place]  [address]. [place], [address]   ENDOSCOPY PROCEDURE REPORT  PATIENT: [name], Jesus  MR#: [id number] BIRTHDATE: 02-12-1951 , 62  yrs. old GENDER: Male ENDOSCOPIST: Milus Banister, MD PROCEDURE DATE:  05/12/2013 PROCEDURE:  EGD w/ biopsy ASA CLASS:     Class II INDICATIONS:  anemia, gerd. MEDICATIONS: Propofol (Diprivan) 80 mg IV TOPICAL ANESTHETIC: none  DESCRIPTION OF PROCEDURE: After the risks benefits and alternatives of the procedure were thoroughly explained, informed consent was obtained.  The LB [id number] Loaner [id number] endoscope was introduced through the mouth and advanced to the second portion of the duodenum. Without limitations.  The instrument was slowly withdrawn as the mucosa was fully examined.   There were several erosions in the distal stomach.  Biopsies were taken and sent to pathology.  There was linear, mild relfux related esophagitis.  The examination was otherwise normal.  Retroflexed views revealed no abnormalities.     The scope was then withdrawn from the patient and the procedure completed.  COMPLICATIONS: There were no complications. ENDOSCOPIC IMPRESSION: There were several erosions in the distal stomach.  Biopsies were taken and sent to pathology.  There was linear, mild relfux related esophagitis.  The examination was otherwise normal.  RECOMMENDATIONS: You should be taking twice daily OTC omeprazole (one pill 20-30 min before BF and dinner meals).  You should be completely avoiding NSAID type pain medicines.  IF biopsies show H.  pylori, you will be started on appropriate antibiotics.   eSigned:  Milus Banister, MD 05/12/2013 3:47 PM

## 2013-05-15 ENCOUNTER — Telehealth: Payer: Self-pay | Admitting: *Deleted

## 2013-05-15 NOTE — Telephone Encounter (Signed)
  Follow up Call-  Call back number 05/12/2013  Post procedure Call Back phone  # [phone]  Permission to leave phone message Yes     Patient questions:  Do you have a fever, pain , or abdominal swelling? no Pain Score  0 *  Have you tolerated food without any problems? yes  Have you been able to return to your normal activities? yes  Do you have any questions about your discharge instructions: Diet   no Medications  no Follow up visit  no  Do you have questions or concerns about your Care? no  Actions: * If pain score is 4 or above: No action needed, pain <4.

## 2013-05-18 ENCOUNTER — Encounter: Payer: Self-pay | Admitting: Gastroenterology

## 2015-04-01 ENCOUNTER — Emergency Department (HOSPITAL_BASED_OUTPATIENT_CLINIC_OR_DEPARTMENT_OTHER): Payer: Medicaid Other

## 2015-04-01 ENCOUNTER — Emergency Department (HOSPITAL_BASED_OUTPATIENT_CLINIC_OR_DEPARTMENT_OTHER)
Admission: EM | Admit: 2015-04-01 | Discharge: 2015-04-01 | Disposition: A | Discharge status: Home / Self Care | Payer: Medicaid Other | Attending: Emergency Medicine | Admitting: Emergency Medicine

## 2015-04-01 ENCOUNTER — Encounter (HOSPITAL_BASED_OUTPATIENT_CLINIC_OR_DEPARTMENT_OTHER): Payer: Self-pay | Admitting: Emergency Medicine

## 2015-04-01 DIAGNOSIS — Y9389 Activity, other specified: Secondary | ICD-10-CM | POA: Diagnosis not present

## 2015-04-01 DIAGNOSIS — Z79899 Other long term (current) drug therapy: Secondary | ICD-10-CM | POA: Insufficient documentation

## 2015-04-01 DIAGNOSIS — E119 Type 2 diabetes mellitus without complications: Secondary | ICD-10-CM | POA: Diagnosis not present

## 2015-04-01 DIAGNOSIS — Z86010 Personal history of colonic polyps: Secondary | ICD-10-CM | POA: Diagnosis not present

## 2015-04-01 DIAGNOSIS — Z7982 Long term (current) use of aspirin: Secondary | ICD-10-CM | POA: Insufficient documentation

## 2015-04-01 DIAGNOSIS — Z791 Long term (current) use of non-steroidal anti-inflammatories (NSAID): Secondary | ICD-10-CM | POA: Diagnosis not present

## 2015-04-01 DIAGNOSIS — Y998 Other external cause status: Secondary | ICD-10-CM | POA: Diagnosis not present

## 2015-04-01 DIAGNOSIS — S92311A Displaced fracture of first metatarsal bone, right foot, initial encounter for closed fracture: Secondary | ICD-10-CM | POA: Insufficient documentation

## 2015-04-01 DIAGNOSIS — S99911A Unspecified injury of right ankle, initial encounter: Secondary | ICD-10-CM | POA: Diagnosis present

## 2015-04-01 DIAGNOSIS — S92251A Displaced fracture of navicular [scaphoid] of right foot, initial encounter for closed fracture: Secondary | ICD-10-CM | POA: Diagnosis not present

## 2015-04-01 DIAGNOSIS — S92301A Fracture of unspecified metatarsal bone(s), right foot, initial encounter for closed fracture: Secondary | ICD-10-CM

## 2015-04-01 DIAGNOSIS — Y92481 Parking lot as the place of occurrence of the external cause: Secondary | ICD-10-CM | POA: Diagnosis not present

## 2015-04-01 MED ORDER — OXYCODONE HCL 5 MG PO TABS: 2.5000 mg | ORAL_TABLET | ORAL | Status: DC | PRN

## 2015-04-01 NOTE — ED Notes (Signed)
MD at bedside. 

## 2015-04-01 NOTE — ED Notes (Signed)
Pt reports he fell down at his home while outside and hurt right foot

## 2015-04-01 NOTE — Discharge Instructions (Signed)
Take 4 over the counter ibuprofen tablets 3 times a day or 2 over-the-counter naproxen tablets twice a day for pain.  Take Tylenol and ibuprofen or naproxen for your pain. Take the pain medicine only if he needed. Call the orthopedist today to get an appointment this week. Fractura metatarsiana (Metatarsal Fracture) Ardelia Mems fractura metatarsiana es una quebradura que se produce en un hueso metatarsiano. Estos huesos [employer] de los dedos de los pies con los del [place]. CAUSAS Este tipo de fractura puede deberse a lo siguiente:  La torcedura repentina del pie.  La cada [employer].  El uso excesivo de esa zona o el ejercicio repetitivo. FACTORES DE RIESGO Es ms probable que esta afeccin se manifieste en las personas que:  Therapist, occupational deportes de Diplomatic Services operational officer.  Tienen una enfermedad [employer].  Tienen bajos niveles de calcio. SNTOMAS Los sntomas de esta afeccin incluyen lo siguiente:  Dolor que empeora al caminar o al ponerse de pie.  Dolor al ejercer presin [employer] o al [employer] dedos.  Hinchazn.  Hematomas en la parte superior o inferior del pie.  Un pie que parece ms corto que el otro. DIAGNSTICO Esta afeccin se diagnostica mediante un examen fsico. Adems, pueden hacerle estudios de diagnstico por imgenes, por ejemplo:  Radiografas.  Tomografa computarizada.  Resonancia magntica. TRATAMIENTO El tratamiento de esta afeccin depender de la gravedad y de si el hueso se ha desplazado. El tratamiento puede incluir lo siguiente:  Reposo.  El uso de un aparato ortopdico en el pie, como un yeso, una frula o una bota durante varias semanas.  El uso de Ida.  Ciruga para reacomodar los [employer] posicin normal. Por lo general, se necesitar una ciruga si hay muchos fragmentos de hueso roto o si los [employer] se han desplazado mucho (fractura con desplazamiento).  Fisioterapia. Esta puede ayudar a recuperar toda la movilidad y la  fuerza en el pie. Hasta que la fractura se consolide, deber ver de nuevo al mdico para que le tome radiografas. El Animator las radiografas para asegurarse de que el pie se est recuperando como corresponde. INSTRUCCIONES PARA EL CUIDADO EN EL HOGAR  Si tiene un yeso:  No introduzca nada adentro del yeso para rascarse la piel. Esto puede aumentar el riesgo de tener infecciones.  [place] piel de alrededor del yeso. Informe al mdico cualquier inquietud que tenga. Puede aplicar una locin en la piel seca alrededor de los bordes del yeso. No aplique locin en la piel por debajo del yeso.  Mantenga el yeso seco y limpio. Si tiene una frula o una bota ortopdica:  selas como se lo haya indicado el mdico. Quteselas solamente como se lo haya indicado el mdico.  Afloje la frula si los dedos se le entumecen, si siente hormigueos o si se le enfran y se tornan de Optician, dispensing.  Mantngalo limpio y seco. El bao  No tome baos de inmersin, no nade ni use el jacuzzi hasta que el mdico lo autorice. Pregntele al mdico si puede ducharse. Thurston Pounds solo le permitan tomar baos de [place].  Si el mdico lo autoriza a que se bae y se duche, [place] el yeso o la frula con una bolsa de plstico hermtica para protegerlos del agua. No permita que el yeso o la frula se mojen. Control del dolor, la rigidez y la hinchazn  Si se lo indican, colquese hielo en la zona de la lesin (si tiene una frula,  no un yeso).  Ponga el hielo en una bolsa plstica.  Coloque una toalla entre la piel y la bolsa de hielo.  Coloque el hielo durante 79minutos, 2 a 3veces por Training and development officer.  Mueva los dedos de los pies con frecuencia para evitar que se entumezcan y para reducir la hinchazn.  Cuando est sentado o acostado, eleve la zona de la lesin por encima del nivel del corazn. Conducir  No conduzca ni opere maquinaria pesada mientras toma analgsicos.  No conduzca mientras use un  soporte en el pie que utiliza para conducir. Actividad  Reanude sus actividades normales como se lo haya indicado el mdico. Pregntele al mdico qu actividades son seguras para usted.  Haga ejercicio como se lo haya indicado el fisioterapeuta o el mdico. Seguridad  No apoye el peso del cuerpo sobre el pie lesionado hasta que lo autorice el mdico. Use las [employer] se lo haya indicado el mdico. Instrucciones generales  No ejerza presin en ninguna parte del yeso o de la frula hasta que se hayan endurecido. Esto puede tomar [employer].  No consuma ningn producto que contenga tabaco, lo que incluye cigarrillos, tabaco de Higher education careers adviser o Psychologist, sport and exercise. El tabaco puede retardar la consolidacin de la fractura. Si necesita ayuda para dejar de fumar, consulte al mdico.  Tome los medicamentos solamente como se lo haya indicado el mdico.  Concurra a todas las visitas de control como se lo haya indicado el mdico. Esto es importante. SOLICITE ATENCIN MDICA SI:  Jaclynn Guarneri.  El yeso, la frula o la bota estn muy ajustados o muy flojos.  El yeso, la frula o la bota se daan.  El medicamento no Production designer, theatre/television/film.  Siente dolor, hormigueo o entumecimiento en el pie, y estos sntomas no desaparecen. SOLICITE ATENCIN MDICA DE INMEDIATO SI:  Siente dolor intenso.  Tiene hormigueo o entumecimiento que [employer].  Tiene el pie fro o adormecido.  El pie cambia de color.   Esta informacin no tiene Marine scientist el consejo del mdico. Asegrese de hacerle al mdico cualquier pregunta que tenga.   Document Released: 12/03/2004 Document Revised: 07/10/2014 Elsevier Interactive Patient Education [employer].

## 2015-04-01 NOTE — ED Notes (Signed)
Patient transported to radiology department via stretcher.

## 2015-04-01 NOTE — ED Notes (Signed)
Discharge instructions and follow up instructions reviewed with pt. Pt verbalizes understanding of need to see orthopedic doctor today at 3:00. Pt given address and telephone number of follow up doctor, + teach back provided.

## 2015-04-01 NOTE — ED Provider Notes (Signed)
CSN: [id number]     Arrival date & time 04/01/15  [id number] History   First MD Initiated Contact with Patient 04/01/15 1005     Chief Complaint  Patient presents with  . Foot Pain     (Consider location/radiation/quality/duration/timing/severity/associated sxs/prior Treatment) Patient is a 65 y.o. male presenting with lower extremity pain. The history is provided by the patient.  Foot Pain This is a new problem. The current episode started 1 to 2 hours ago. The problem occurs constantly. The problem has not changed since onset.Pertinent negatives include no chest pain, no abdominal pain, no headaches and no shortness of breath. The symptoms are aggravated by twisting and walking. Nothing relieves the symptoms. He has tried nothing for the symptoms. The treatment provided no relief.   65 yo M with a chief complaint of right foot pain. About an hour ago patient fell while trying to get out of his car in the parking lot. States that he had pain to his medial right foot. Worse with movement palpation walking. He denies other injury.  Past Medical History  Diagnosis Date  . History of colon polyps   . Hypertension   . Diabetes [facility])    Past Surgical History  Procedure Laterality Date  . Back surgery     History reviewed. No pertinent family history. Social History  Substance Use Topics  . Smoking status: Never Smoker   . Smokeless tobacco: Never Used  . Alcohol Use: No    Review of Systems  Constitutional: Negative for fever and chills.  HENT: Negative for congestion and facial swelling.   Eyes: Negative for discharge and visual disturbance.  Respiratory: Negative for shortness of breath.   Cardiovascular: Negative for chest pain and palpitations.  Gastrointestinal: Negative for vomiting, abdominal pain and diarrhea.  Musculoskeletal: Positive for myalgias and arthralgias.  Skin: Negative for color change and rash.  Neurological: Negative for tremors, syncope and headaches.   Psychiatric/Behavioral: Negative for confusion and dysphoric mood.      Allergies  Review of patient's allergies indicates no known allergies.  Home Medications   Prior to Admission medications   Medication Sig Start Date End Date Taking? Authorizing Provider  aspirin 81 MG tablet Take 81 mg by mouth daily.    Historical Provider, MD  atenolol (TENORMIN) 25 MG tablet Take 25 mg by mouth daily.    Historical Provider, MD  ferrous sulfate 325 (65 FE) MG tablet Take 325 mg by mouth daily with breakfast.    Historical Provider, MD  glimepiride (AMARYL) 2 MG tablet Take 2 mg by mouth daily with breakfast.    Historical Provider, MD  lisinopril (PRINIVIL,ZESTRIL) 5 MG tablet Take 5 mg by mouth daily.    Historical Provider, MD  loratadine (CLARITIN) 10 MG tablet Take 10 mg by mouth daily.    Historical Provider, MD  Multiple Vitamins-Minerals (CENTRUM SILVER PO) Take 1 tablet by mouth daily.    Historical Provider, MD  naproxen (NAPROSYN) 500 MG tablet Take 500 mg by mouth 2 (two) times daily with a meal.    Historical Provider, MD  Omega 3-6-9 Fatty Acids (OMEGA 3-6-9 COMPLEX PO) Take 1 capsule by mouth daily.    Historical Provider, MD  oxyCODONE (ROXICODONE) 5 MG immediate release tablet Take 0.5 tablets (2.5 mg total) by mouth every 4 (four) hours as needed for severe pain. 04/01/15   Deno Etienne, DO  ranitidine (ZANTAC) 300 MG capsule Take 300 mg by mouth every evening.    Historical Provider, MD  BP 122/70 mmHg  Pulse 79  Temp(Src) 98.2 F (36.8 C) (Oral)  Resp 18  Ht 5\' 7"  (1.702 m)  Wt 160 lb (72.576 kg)  BMI 25.05 kg/m2  SpO2 100% Physical Exam  Constitutional: He is oriented to person, place, and time. He appears well-developed and well-nourished.  HENT:  Head: Normocephalic and atraumatic.  Eyes: EOM are normal. Pupils are equal, round, and reactive to light.  Neck: Normal range of motion. Neck supple. No JVD present.  Cardiovascular: Normal rate and regular rhythm.  Exam  reveals no gallop and no friction rub.   No murmur heard. Pulmonary/Chest: No respiratory distress. He has no wheezes.  Abdominal: He exhibits no distension. There is no rebound and no guarding.  Musculoskeletal: Normal range of motion. He exhibits tenderness.  Tender palpation about the base of the first metatarsal on the right foot. Also has some tenderness about the navicular. No noted distal fibular tenderness. Pulse motor and sensation is intact.  Neurological: He is alert and oriented to person, place, and time.  Skin: No rash noted. No pallor.  Psychiatric: He has a normal mood and affect. His behavior is normal.  Nursing note and vitals reviewed.   ED Course  Procedures (including critical care time) Labs Review Labs Reviewed - No data to display  Imaging Review Dg Ankle Complete Right  04/01/2015  CLINICAL DATA:  Fall this morning, turned ankle.  Ankle pain. EXAM: RIGHT ANKLE - COMPLETE 3+ VIEW COMPARISON:  None. FINDINGS: Irregularity noted along the lateral malleolus. Small density adjacent to the tip of the lateral malleolus could reflect a small avulsed fragment. Joint spaces are maintained. No other acute bony abnormality. Soft tissues are intact. IMPRESSION: Questionable small avulsed fragment off the tip of the lateral malleolus. Recommend correlation for pain in this area. Electronically Signed   By: Rolm Baptise M.D.   On: 04/01/2015 10:34   Dg Foot Complete Right  04/01/2015  CLINICAL DATA:  Pain following fall EXAM: RIGHT FOOT COMPLETE - 3+ VIEW COMPARISON:  None. FINDINGS: Frontal, oblique, and lateral views were obtained. There is a fracture along the lateral proximal first metatarsal with displacement of fracture fragments. There is a lucency in the medial proximal navicular, consistent with either a nondisplaced fracture or incomplete fusion of an apophysis in this area. There are no other findings suggesting potential fracture. No dislocation. The joint spaces appear  normal. There are spurs arising from the posterior and inferior calcaneus. IMPRESSION: Fracture along the proximal lateral first metatarsal with displacement of fracture fragments. Question subtle nondisplaced fracture versus incomplete fusion of an apophysis along the medial proximal navicular. No other evidence suggesting fracture. No dislocation. No appreciable joint space narrowing. There are calcaneal spurs. Electronically Signed   By: Lowella Grip III M.D.   On: 04/01/2015 10:35   I have personally reviewed and evaluated these images and lab results as part of my medical decision-making.   EKG Interpretation None      MDM   Final diagnoses:  Metatarsal fracture, right, closed, initial encounter  Navicular fracture, right, closed, initial encounter    65 yo M with a chief complaints of a fall. Found to have the first metatarsal as well as possible navicular fracture. Patient is tender at both locations. Will place the patient in a postop shoe to make him nonweightbearing and follow-up with orthopedics.  12:38 PM:  I have discussed the diagnosis/risks/treatment options with the patient and believe the pt to be eligible for discharge home to  follow-up with Ortho. We also discussed returning to the ED immediately if new or worsening sx occur. We discussed the sx which are most concerning (e.g., sudden worsening pain) that necessitate immediate return. Medications administered to the patient during their visit and any new prescriptions provided to the patient are listed below.  Medications given during this visit Medications - No data to display  Discharge Medication List as of 04/01/2015 10:50 AM    START taking these medications   Details  oxyCODONE (ROXICODONE) 5 MG immediate release tablet Take 0.5 tablets (2.5 mg total) by mouth every 4 (four) hours as needed for severe pain., Starting 04/01/2015, Until Discontinued, Print        The patient appears reasonably screen and/or  stabilized for discharge and I doubt any other medical condition or other [facility] requiring further screening, evaluation, or treatment in the ED at this time prior to discharge.      Deno Etienne, DO 04/01/15 1238

## 2015-04-08 ENCOUNTER — Other Ambulatory Visit: Payer: Self-pay | Admitting: Orthopaedic Surgery

## 2015-04-08 DIAGNOSIS — M79671 Pain in right foot: Secondary | ICD-10-CM

## 2015-04-10 ENCOUNTER — Ambulatory Visit
Admission: RE | Admit: 2015-04-10 | Discharge: 2015-04-10 | Disposition: A | Discharge status: Home / Self Care | Payer: Medicaid Other | Source: Ambulatory Visit | Attending: Orthopaedic Surgery | Admitting: Orthopaedic Surgery

## 2015-04-10 DIAGNOSIS — M79671 Pain in right foot: Secondary | ICD-10-CM

## 2015-04-29 ENCOUNTER — Emergency Department (HOSPITAL_COMMUNITY)
Admission: EM | Admit: 2015-04-29 | Discharge: 2015-04-29 | Disposition: A | Discharge status: Home / Self Care | Payer: Medicaid Other | Source: Home / Self Care

## 2015-04-29 ENCOUNTER — Encounter (HOSPITAL_COMMUNITY): Payer: Self-pay | Admitting: *Deleted

## 2015-04-29 DIAGNOSIS — R69 Illness, unspecified: Principal | ICD-10-CM

## 2015-04-29 DIAGNOSIS — J111 Influenza due to unidentified influenza virus with other respiratory manifestations: Secondary | ICD-10-CM | POA: Diagnosis not present

## 2015-04-29 MED ORDER — OSELTAMIVIR PHOSPHATE 75 MG PO CAPS: 75.0000 mg | ORAL_CAPSULE | Freq: Two times a day (BID) | ORAL | Status: DC

## 2015-04-29 NOTE — ED Notes (Signed)
Pt  Reports  Symptoms  Of  Fever  Body  Aches      Since  Yesterday  Pt  Sitting  Upright  On  The  Exam table        Pt   Reports       Got  Flu  Shot

## 2015-04-29 NOTE — ED Provider Notes (Signed)
CSN: [id number]     Arrival date & time 04/29/15  1548 History   None    Chief Complaint  Patient presents with  . Fever   (Consider location/radiation/quality/duration/timing/severity/associated sxs/prior Treatment) Patient is a 65 y.o. male presenting with fever. The history is provided by the patient.  Fever Temp source:  Oral Severity:  Moderate Onset quality:  Sudden Duration:  1 day Progression:  Unchanged Chronicity:  New Relieved by:  None tried Worsened by:  Nothing tried Ineffective treatments:  None tried Associated symptoms: chills, congestion, cough, myalgias and rhinorrhea   Associated symptoms: no nausea and no vomiting     Past Medical History  Diagnosis Date  . History of colon polyps   . Hypertension   . Diabetes [facility])    Past Surgical History  Procedure Laterality Date  . Back surgery     History reviewed. No pertinent family history. Social History  Substance Use Topics  . Smoking status: Never Smoker   . Smokeless tobacco: Never Used  . Alcohol Use: No    Review of Systems  Constitutional: Positive for fever and chills.  HENT: Positive for congestion and rhinorrhea.   Respiratory: Positive for cough.   Cardiovascular: Negative.   Gastrointestinal: Negative.  Negative for nausea and vomiting.  Musculoskeletal: Positive for myalgias.  Skin: Negative.   All other systems reviewed and are negative.   Allergies  Review of patient's allergies indicates no known allergies.  Home Medications   Prior to Admission medications   Medication Sig Start Date End Date Taking? Authorizing Provider  aspirin 81 MG tablet Take 81 mg by mouth daily.    Historical Provider, MD  atenolol (TENORMIN) 25 MG tablet Take 25 mg by mouth daily.    Historical Provider, MD  ferrous sulfate 325 (65 FE) MG tablet Take 325 mg by mouth daily with breakfast.    Historical Provider, MD  glimepiride (AMARYL) 2 MG tablet Take 2 mg by mouth daily with breakfast.     Historical Provider, MD  lisinopril (PRINIVIL,ZESTRIL) 5 MG tablet Take 5 mg by mouth daily.    Historical Provider, MD  loratadine (CLARITIN) 10 MG tablet Take 10 mg by mouth daily.    Historical Provider, MD  Multiple Vitamins-Minerals (CENTRUM SILVER PO) Take 1 tablet by mouth daily.    Historical Provider, MD  naproxen (NAPROSYN) 500 MG tablet Take 500 mg by mouth 2 (two) times daily with a meal.    Historical Provider, MD  Omega 3-6-9 Fatty Acids (OMEGA 3-6-9 COMPLEX PO) Take 1 capsule by mouth daily.    Historical Provider, MD  oseltamivir (TAMIFLU) 75 MG capsule Take 1 capsule (75 mg total) by mouth every 12 (twelve) hours. Take all of medication. 04/29/15   Jesus Fischer, MD  oxyCODONE (ROXICODONE) 5 MG immediate release tablet Take 0.5 tablets (2.5 mg total) by mouth every 4 (four) hours as needed for severe pain. 04/01/15   Deno Etienne, DO  ranitidine (ZANTAC) 300 MG capsule Take 300 mg by mouth every evening.    Historical Provider, MD   Meds Ordered and Administered this Visit  Medications - No data to display  BP 136/76 mmHg  Pulse 106  Temp(Src) 101.5 F (38.6 C) (Oral)  Resp 16  SpO2 97% No data found.   Physical Exam  Constitutional: He is oriented to person, place, and time. He appears well-developed and well-nourished. No distress.  HENT:  Right Ear: External ear normal.  Left Ear: External ear normal.  Mouth/Throat: Oropharynx  is clear and moist.  Neck: Normal range of motion. Neck supple.  Cardiovascular: Normal rate, regular rhythm, normal heart sounds and intact distal pulses.   Pulmonary/Chest: Effort normal and breath sounds normal.  Abdominal: Soft. Bowel sounds are normal. There is no tenderness.  Lymphadenopathy:    He has no cervical adenopathy.  Neurological: He is alert and oriented to person, place, and time.  Skin: Skin is warm and dry.  Nursing note and vitals reviewed.   ED Course  Procedures (including critical care time)  Labs Review Labs  Reviewed - No data to display  Imaging Review No results found.   Visual Acuity Review  Right Eye Distance:   Left Eye Distance:   Bilateral Distance:    Right Eye Near:   Left Eye Near:    Bilateral Near:         MDM   1. Influenza-like illness        Jesus Fischer, MD 04/29/15 [phone]

## 2015-04-29 NOTE — ED Notes (Signed)
No answer in waiting room 

## 2015-08-06 ENCOUNTER — Ambulatory Visit (HOSPITAL_COMMUNITY)
Admission: EM | Admit: 2015-08-06 | Discharge: 2015-08-06 | Disposition: A | Discharge status: Home / Self Care | Payer: Medicaid Other | Attending: Family Medicine | Admitting: Family Medicine

## 2015-08-06 ENCOUNTER — Encounter (HOSPITAL_COMMUNITY): Payer: Self-pay | Admitting: Emergency Medicine

## 2015-08-06 DIAGNOSIS — J069 Acute upper respiratory infection, unspecified: Secondary | ICD-10-CM | POA: Diagnosis not present

## 2015-08-06 MED ORDER — BENZONATATE 100 MG PO CAPS: 100.0000 mg | ORAL_CAPSULE | Freq: Three times a day (TID) | ORAL | Status: DC

## 2015-08-06 MED ORDER — OXYCODONE-ACETAMINOPHEN 5-325 MG PO TABS: 1.0000 | ORAL_TABLET | ORAL | Status: DC | PRN

## 2015-08-06 NOTE — Discharge Instructions (Signed)
Infeccin del tracto respiratorio superior, adultos (Upper Respiratory Infection, Adult) La mayora de las infecciones del tracto respiratorio superior estn causadas por un virus. Un infeccin del tracto respiratorio superior afecta la nariz, la garganta y las vas respiratorias superiores. El tipo ms comn de infeccin del tracto respiratorio superior es el resfro comn. CUIDADOS EN EL HOGAR   Tome los medicamentos solamente como se lo haya indicado el mdico.  A fin de aliviar el dolor de garganta, haga grgaras con solucin salina templada o consuma caramelos para la tos, como se lo haya indicado el mdico.  Use un humidificador de vapor clido o inhale el vapor de la ducha para aumentar la humedad del aire. Esto facilitar la respiracin.  Beba suficiente lquido para mantener el pis (orina) claro o de color amarillo plido.  Tome sopas y caldos transparentes.  Siga una dieta saludable.  Descanse todo lo que sea necesario.  Regrese al trabajo cuando la fiebre haya desaparecido o el mdico le diga que puede hacerlo.  Es posible que deba quedarse en su casa durante un tiempo prolongado, para no transmitir la infeccin a los dems.  Tambin puede usar un barbijo y lavarse las manos con frecuencia para evitar el contagio del virus.  Si tiene asma, use el inhalador con mayor frecuencia.  No consuma ningn producto que contenga tabaco, lo que incluye cigarrillos, tabaco de mascar o cigarrillos electrnicos. Si necesita ayuda para dejar de fumar, consulte al mdico. SOLICITE AYUDA SI:  Siente que empeora o que no mejora.  Los medicamentos no logran aliviar los sntomas.  Tiene escalofros.  La dificultad para respirar es peor.  Tiene mucosidad marrn o roja.  Tiene una secrecin amarilla o marrn de la nariz.  Le duele la cara, especialmente al inclinarse hacia adelante.  Tiene fiebre.  Tiene los ganglios del cuello hinchados.  Siente dolor al tragar.  Tiene zonas  blancas en la parte de atrs de la garganta. SOLICITE AYUDA DE INMEDIATO SI:   Los siguientes sntomas son muy intensos o constantes:  Dolor de cabeza.  Dolor de odos.  Dolor en la frente, detrs de los ojos y por encima de los pmulos (dolor sinusal).  Dolor en el pecho.  Tiene enfermedad pulmonar prolongada (crnica) y cualquiera de estos sntomas:  Sibilancias.  Tos prolongada.  Tos con sangre.  Cambio en la mucosidad habitual.  Presenta rigidez en el cuello.  Tiene cambios en:  La visin.  La audicin.  El pensamiento.  El estado de nimo. ASEGRESE DE QUE:   Comprende estas instrucciones.  Controlar su afeccin.  Recibir ayuda de inmediato si no mejora o si empeora.   Esta informacin no tiene como fin reemplazar el consejo del mdico. Asegrese de hacerle al mdico cualquier pregunta que tenga.   Document Released: 07/28/2010 Document Revised: 07/10/2014 Elsevier Interactive Patient Education 2016 Elsevier Inc.  

## 2015-08-06 NOTE — ED Notes (Signed)
PT made aware that he cannot drive or operate machinery during or after taking prescribed pain medicine. PT acknowledges instructions

## 2015-08-06 NOTE — ED Notes (Signed)
PT reports cough, headache, runny eyes, nasal congestion since yesterday. PT reports fever that started today.

## 2015-08-06 NOTE — ED Provider Notes (Signed)
CSN: [id number]     Arrival date & time 08/06/15  1549 History   First MD Initiated Contact with Patient 08/06/15 1621     No chief complaint on file.  (Consider location/radiation/quality/duration/timing/severity/associated sxs/prior Treatment) HPI History obtained from patient:  Pt presents with the cc of:  Cold symptoms Duration of symptoms: 1 day Treatment prior to arrival: Over-the-counter medications Context: Sudden onset of nasal congestion cough and scratchy throat Other symptoms include: Arthritis pain Pain score: 3 FAMILY HISTORY: No family history    Past Medical History  Diagnosis Date  . History of colon polyps   . Hypertension   . Diabetes [facility])    Past Surgical History  Procedure Laterality Date  . Back surgery     No family history on file. Social History  Substance Use Topics  . Smoking status: Never Smoker   . Smokeless tobacco: Never Used  . Alcohol Use: No    Review of Systems  Denies: HEADACHE, NAUSEA, ABDOMINAL PAIN, CHEST PAIN, CONGESTION, DYSURIA, SHORTNESS OF BREATH  Allergies  Review of patient's allergies indicates no known allergies.  Home Medications   Prior to Admission medications   Medication Sig Start Date End Date Taking? Authorizing Provider  aspirin 81 MG tablet Take 81 mg by mouth daily.    Historical Provider, MD  atenolol (TENORMIN) 25 MG tablet Take 25 mg by mouth daily.    Historical Provider, MD  ferrous sulfate 325 (65 FE) MG tablet Take 325 mg by mouth daily with breakfast.    Historical Provider, MD  glimepiride (AMARYL) 2 MG tablet Take 2 mg by mouth daily with breakfast.    Historical Provider, MD  lisinopril (PRINIVIL,ZESTRIL) 5 MG tablet Take 5 mg by mouth daily.    Historical Provider, MD  loratadine (CLARITIN) 10 MG tablet Take 10 mg by mouth daily.    Historical Provider, MD  Multiple Vitamins-Minerals (CENTRUM SILVER PO) Take 1 tablet by mouth daily.    Historical Provider, MD  naproxen (NAPROSYN) 500 MG tablet  Take 500 mg by mouth 2 (two) times daily with a meal.    Historical Provider, MD  Omega 3-6-9 Fatty Acids (OMEGA 3-6-9 COMPLEX PO) Take 1 capsule by mouth daily.    Historical Provider, MD  oseltamivir (TAMIFLU) 75 MG capsule Take 1 capsule (75 mg total) by mouth every 12 (twelve) hours. Take all of medication. 04/29/15   [name], MD  oxyCODONE (ROXICODONE) 5 MG immediate release tablet Take 0.5 tablets (2.5 mg total) by mouth every 4 (four) hours as needed for severe pain. 04/01/15   Deno Etienne, DO  ranitidine (ZANTAC) 300 MG capsule Take 300 mg by mouth every evening.    Historical Provider, MD   Meds Ordered and Administered this Visit  Medications - No data to display  BP 126/80 mmHg  Pulse 73  Temp(Src) 99.2 F (37.3 C) (Oral)  Resp 16  SpO2 97% No data found.   Physical Exam NURSES NOTES AND VITAL SIGNS REVIEWED. CONSTITUTIONAL: Well developed, well nourished, no acute distress HEENT: normocephalic, atraumatic EYES: Conjunctiva normal NECK:normal ROM, supple, no adenopathy PULMONARY:No respiratory distress, normal effort ABDOMINAL: Soft, ND, NT BS+, No CVAT MUSCULOSKELETAL: Normal ROM of all extremities,  SKIN: warm and dry without rash PSYCHIATRIC: Mood and affect, behavior are normal  ED Course  Procedures (including critical care time)  Labs Review Labs Reviewed - No data to display  Imaging Review No results found.   Visual Acuity Review  Right Eye Distance:   Left  Eye Distance:   Bilateral Distance:    Right Eye Near:   Left Eye Near:    Bilateral Near:       rx for percocet for arthritis pain. Tessalon Perles for cough  MDM   1. URI (upper respiratory infection)     Patient is reassured that there are no issues that require transfer to higher level of care at this time or additional tests. Patient is advised to continue home symptomatic treatment. Patient is advised that if there are new or worsening symptoms to attend the emergency  department, contact primary care provider, or return to UC. Instructions of care provided discharged home in stable condition.    THIS NOTE WAS GENERATED USING A VOICE RECOGNITION SOFTWARE PROGRAM. ALL REASONABLE EFFORTS  WERE MADE TO PROOFREAD THIS DOCUMENT FOR ACCURACY.  I have verbally reviewed the discharge instructions with the patient. A printed AVS was given to the patient.  All questions were answered prior to discharge.      Konrad Felix, [place] 08/06/15 [phone]

## 2015-11-07 ENCOUNTER — Encounter (HOSPITAL_COMMUNITY): Payer: Self-pay | Admitting: Emergency Medicine

## 2015-11-07 ENCOUNTER — Ambulatory Visit (HOSPITAL_COMMUNITY)
Admission: EM | Admit: 2015-11-07 | Discharge: 2015-11-07 | Disposition: A | Discharge status: Home / Self Care | Payer: Medicaid Other | Attending: Family Medicine | Admitting: Family Medicine

## 2015-11-07 DIAGNOSIS — I10 Essential (primary) hypertension: Secondary | ICD-10-CM

## 2015-11-07 DIAGNOSIS — E11319 Type 2 diabetes mellitus with unspecified diabetic retinopathy without macular edema: Secondary | ICD-10-CM

## 2015-11-07 DIAGNOSIS — E113299 Type 2 diabetes mellitus with mild nonproliferative diabetic retinopathy without macular edema, unspecified eye: Secondary | ICD-10-CM

## 2015-11-07 MED ORDER — METFORMIN HCL 500 MG PO TABS: 500.0000 mg | ORAL_TABLET | Freq: Two times a day (BID) | ORAL | 0 refills | Status: DC

## 2015-11-07 MED ORDER — ATENOLOL 25 MG PO TABS: 50.0000 mg | ORAL_TABLET | Freq: Every day | ORAL | 0 refills | Status: DC

## 2015-11-07 MED ORDER — LISINOPRIL 5 MG PO TABS: 5.0000 mg | ORAL_TABLET | Freq: Every day | ORAL | 0 refills | Status: AC

## 2015-11-07 NOTE — ED Triage Notes (Signed)
Pt is here today for medication refills.  Pt had a PCP whose clinic is now closed.  Pt is looking for a referral for a new PCP and needs three medications filled today.  Pt only has complaint of a headache in the back of his head for the last three days.  He denies any CP, SOB, or tachycardia.

## 2015-11-07 NOTE — ED Provider Notes (Signed)
CSN: [id number]     Arrival date & time 11/07/15  1010 History   None    Chief Complaint  Patient presents with  . Medication Refill   (Consider location/radiation/quality/duration/timing/severity/associated sxs/prior Treatment) Patient is needing refill on meds for htn and diabetes.   The history is provided by the patient.  Medication Refill  Medications/supplies requested:  Metformin, metoprolol, lisinopril Reason for request:  Clinic/provider not available Medications taken before: yes - see home medications   Patient has complete original prescription information: yes     Past Medical History:  Diagnosis Date  . Diabetes ([place])   . History of colon polyps   . Hypertension    Past Surgical History:  Procedure Laterality Date  . BACK SURGERY     History reviewed. No pertinent family history. Social History  Substance Use Topics  . Smoking status: Never Smoker  . Smokeless tobacco: Never Used  . Alcohol use No    Review of Systems  Constitutional: Negative.   HENT: Negative.   Eyes: Negative.   Respiratory: Negative.   Cardiovascular: Negative.   Gastrointestinal: Negative.   Endocrine: Negative.   Genitourinary: Negative.   Musculoskeletal: Negative.   Skin: Negative.   Allergic/Immunologic: Negative.   Neurological: Negative.   Hematological: Negative.   Psychiatric/Behavioral: Negative.     Allergies  Review of patient's allergies indicates no known allergies.  Home Medications   Prior to Admission medications   Medication Sig Start Date End Date Taking? Authorizing Provider  aspirin 81 MG tablet Take 81 mg by mouth daily.    Historical Provider, MD  atenolol (TENORMIN) 25 MG tablet Take 2 tablets (50 mg total) by mouth daily. 11/07/15   Lysbeth Penner, FNP  benzonatate (TESSALON) 100 MG capsule Take 1 capsule (100 mg total) by mouth every 8 (eight) hours. 08/06/15   Konrad Felix, PA  ferrous sulfate 325 (65 FE) MG tablet Take 325 mg by mouth  daily with breakfast.    Historical Provider, MD  glimepiride (AMARYL) 2 MG tablet Take 2 mg by mouth daily with breakfast.    Historical Provider, MD  lisinopril (PRINIVIL,ZESTRIL) 5 MG tablet Take 1 tablet (5 mg total) by mouth daily. 11/07/15   Lysbeth Penner, FNP  loratadine (CLARITIN) 10 MG tablet Take 10 mg by mouth daily.    Historical Provider, MD  metFORMIN (GLUCOPHAGE) 500 MG tablet Take 1 tablet (500 mg total) by mouth 2 (two) times daily with a meal. 11/07/15   Lysbeth Penner, FNP  montelukast (SINGULAIR) 10 MG tablet Take 10 mg by mouth at bedtime.    Historical Provider, MD  Multiple Vitamins-Minerals (CENTRUM SILVER PO) Take 1 tablet by mouth daily.    Historical Provider, MD  naproxen (NAPROSYN) 500 MG tablet Take 500 mg by mouth 2 (two) times daily with a meal.    Historical Provider, MD  Omega 3-6-9 Fatty Acids (OMEGA 3-6-9 COMPLEX PO) Take 1 capsule by mouth daily.    Historical Provider, MD  oseltamivir (TAMIFLU) 75 MG capsule Take 1 capsule (75 mg total) by mouth every 12 (twelve) hours. Take all of medication. 04/29/15   [name], MD  oxyCODONE (ROXICODONE) 5 MG immediate release tablet Take 0.5 tablets (2.5 mg total) by mouth every 4 (four) hours as needed for severe pain. 04/01/15   Deno Etienne, DO  oxyCODONE-acetaminophen (PERCOCET/ROXICET) 5-325 MG tablet Take 1 tablet by mouth every 4 (four) hours as needed for severe pain. 08/06/15   Konrad Felix, PA  ranitidine (ZANTAC) 300 MG capsule Take 300 mg by mouth every evening.    Historical Provider, MD   Meds Ordered and Administered this Visit  Medications - No data to display  BP 117/69 (BP Location: Left Arm)   Pulse 87   Temp 98.2 F (36.8 C) (Oral)   Resp 16   SpO2 99%  No data found.   Physical Exam  Urgent Care Course   Clinical Course    Procedures (including critical care time)  Labs Review Labs Reviewed - No data to display  Imaging Review No results found.   Visual Acuity  Review  Right Eye Distance:   Left Eye Distance:   Bilateral Distance:    Right Eye Near:   Left Eye Near:    Bilateral Near:         MDM   1. Essential hypertension   2. 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

## 2015-12-31 ENCOUNTER — Telehealth (INDEPENDENT_AMBULATORY_CARE_PROVIDER_SITE_OTHER): Payer: Self-pay

## 2015-12-31 ENCOUNTER — Ambulatory Visit (INDEPENDENT_AMBULATORY_CARE_PROVIDER_SITE_OTHER): Payer: Self-pay | Admitting: Orthopedic Surgery

## 2015-12-31 ENCOUNTER — Ambulatory Visit (INDEPENDENT_AMBULATORY_CARE_PROVIDER_SITE_OTHER): Payer: Self-pay

## 2015-12-31 ENCOUNTER — Ambulatory Visit (INDEPENDENT_AMBULATORY_CARE_PROVIDER_SITE_OTHER): Payer: Medicaid Other | Admitting: Orthopedic Surgery

## 2015-12-31 ENCOUNTER — Encounter (INDEPENDENT_AMBULATORY_CARE_PROVIDER_SITE_OTHER): Payer: Self-pay | Admitting: Orthopedic Surgery

## 2015-12-31 VITALS — BP 106/68 | HR 60 | Resp 12 | Ht 67.0 in | Wt 165.0 lb

## 2015-12-31 DIAGNOSIS — G8929 Other chronic pain: Secondary | ICD-10-CM | POA: Diagnosis not present

## 2015-12-31 DIAGNOSIS — M545 Low back pain, unspecified: Secondary | ICD-10-CM | POA: Insufficient documentation

## 2015-12-31 DIAGNOSIS — M79674 Pain in right toe(s): Secondary | ICD-10-CM | POA: Diagnosis not present

## 2015-12-31 DIAGNOSIS — M79671 Pain in right foot: Secondary | ICD-10-CM

## 2015-12-31 DIAGNOSIS — M79604 Pain in right leg: Secondary | ICD-10-CM | POA: Diagnosis not present

## 2015-12-31 MED ORDER — DICLOFENAC SODIUM 1 % TD GEL: 2.0000 g | Freq: Four times a day (QID) | TRANSDERMAL | 1 refills | Status: DC

## 2015-12-31 MED ORDER — DICLOFENAC SODIUM 1 % TD GEL
2.0000 g | Freq: Four times a day (QID) | TRANSDERMAL | Status: DC
Start: 2015-12-31 — End: 2015-12-31

## 2015-12-31 NOTE — Telephone Encounter (Signed)
Patient's insurance will not cover this med.

## 2015-12-31 NOTE — Progress Notes (Addendum)
Office Visit Note   Patient: Jesus [name]           Date of Birth: [birth year]           MRN: [id number] Visit Date: 12/31/2015              Requested by: No referring provider defined for this encounter. PCP: Jesus Potash, PA-C (Inactive)   Assessment & Plan: Visit Diagnoses:  1. Right foot pain   2. Painful legs and moving toes of right foot   3. Chronic midline low back pain without sciatica     Plan: #1 Voltaren gel prescription sent to pharmacy #2 schedule MRI scan of the lumbar spine for evaluation of arthritis versus HNP  Follow-Up Instructions: Return in about 3 weeks (around 01/21/2016) for REVIEW MRI SCAN.   Orders:  Orders Placed This Encounter  Procedures  . XR Foot Complete Right  . MR LUMBAR SPINE LIMITED WO CONTRAST   Meds ordered this encounter Voltaren Gel 1%   Procedures: No procedures performed   Clinical Data: No additional findings.   Subjective: Chief Complaint  Patient presents with  . Right Foot - Pain    Pt complains of pain on soles of feet  . Lower Back - Pain    BIL radiating to buttocks and thighs    HPI  [name] returns today for follow-up of his right foot pain. Had a proximal first metatarsal fracture up. He still complains of pain in the midfoot area of both the plantar and dorsally. He has been treated with Ultram. We have not seen him since April. He comes in today though complaining of basically pain from his metatarsal heads to his calcaneus plantar and dorsally. He is wearing shoes that do have arch supports in them that of true orthotics. Certainly he has had decreased pain but still has complaints.  He also brings up the fact that he has had pain in his lumbar spine treated many years ago. He wanted to consider having injections in his lumbar spine. 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 He denies any radicular symptoms at this  time.      Review of Systems  All other systems reviewed and are negative.      Objective: Vital Signs: BP 106/68   Pulse 60   Resp 12   Ht 5\' 7"  (1.702 m)   Wt 165 lb (74.8 kg)   BMI 25.84 kg/m   Physical Exam  Cardiovascular:  Pulses:      Dorsalis pedis pulses are 1+ on the right side.       Posterior tibial pulses are 1+ on the right side.  Feet:  Right Foot:  Protective Sensation: 1 site tested. 1 site sensed. Skin Integrity: Positive for erythema (TINEA PEDIS FIRST WEB SPACE).    Back Exam   Tenderness  The patient is experiencing tenderness in the lumbar.  Range of Motion  Flexion: 70   Muscle Strength  Right Quadriceps:  4/5  Left Quadriceps:  4/5  Right Hamstrings:  4/5  Left Hamstrings:  4/5   Tests  Straight leg raise right: positive at 90 deg Straight leg raise left: positive at 90 deg  Reflexes  Patellar: 3/4 (BILATERAL) Achilles: 0/4 (BILATERAL)  Other  Heel Walk: normal Sensation: normal Gait: normal       Specialty Comments:  No specialty comments available.  Imaging: Xr Foot Complete Right  Result Date: 12/31/2015 3 views right foot reveals that the fracture of the proximal first metatarsal appears healed. 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 Also noted spurring of the midfoot.  Meds ordered this encounter  Medications  . DISCONTD: diclofenac sodium (VOLTAREN) 1 % transdermal gel 2 g  . diclofenac sodium (VOLTAREN) 1 % GEL    Sig: Apply 2 g topically 4 (four) times daily.    Dispense:  5 Tube    Refill:  1    Order Specific Question:   Supervising Provider    Answer:   Garald Balding [id number]     PMFS History: Patient Active Problem List   Diagnosis Date Noted  . Right foot pain 12/31/2015  . Painful legs and moving toes of right foot 12/31/2015  . Chronic midline low back pain without sciatica 12/31/2015  . DIABETES MELLITUS-TYPE II 07/16/2009  . GERD 07/16/2009  .  ABDOMINAL PAIN -GENERALIZED 07/16/2009  . OTHER NONSPECIFIC ABNORMAL SERUM ENZYME LEVELS 07/12/2009  . HYPERTENSION, UNSPECIFIED 02/05/2009  . HYPERLIPIDEMIA-MIXED 01/11/2009  . SHORTNESS OF BREATH 01/11/2009  . CHEST PAIN 01/11/2009   Past Medical History:  Diagnosis Date  . Diabetes ([place])   . History of colon polyps   . Hypertension     History reviewed. No pertinent family history.  Past Surgical History:  Procedure Laterality Date  . BACK SURGERY     Social History   Occupational History  . Not on file.   Social History Main Topics  . Smoking status: Never Smoker  . Smokeless tobacco: Never Used  . Alcohol use No  . Drug use: No  . Sexual activity: Not on file

## 2016-01-01 ENCOUNTER — Other Ambulatory Visit (INDEPENDENT_AMBULATORY_CARE_PROVIDER_SITE_OTHER): Payer: Self-pay | Admitting: Orthopedic Surgery

## 2016-01-01 DIAGNOSIS — M545 Low back pain, unspecified: Secondary | ICD-10-CM

## 2016-01-14 ENCOUNTER — Other Ambulatory Visit (INDEPENDENT_AMBULATORY_CARE_PROVIDER_SITE_OTHER): Payer: Self-pay | Admitting: Orthopedic Surgery

## 2016-01-16 ENCOUNTER — Other Ambulatory Visit: Payer: Medicaid Other

## 2016-01-24 ENCOUNTER — Ambulatory Visit (INDEPENDENT_AMBULATORY_CARE_PROVIDER_SITE_OTHER): Payer: Medicaid Other | Admitting: Orthopaedic Surgery

## 2016-01-27 ENCOUNTER — Ambulatory Visit (INDEPENDENT_AMBULATORY_CARE_PROVIDER_SITE_OTHER): Payer: Medicaid Other | Admitting: Orthopaedic Surgery

## 2016-01-27 NOTE — Progress Notes (Deleted)
Pt was denied his MRI scan as unnecessary and RX Voltaren gel denied per [facility]

## 2017-04-08 ENCOUNTER — Ambulatory Visit: Payer: Medicaid Other | Admitting: Family Medicine

## 2017-04-08 ENCOUNTER — Ambulatory Visit: Payer: Self-pay | Admitting: Family Medicine

## 2017-04-08 NOTE — Progress Notes (Deleted)
No chief complaint on file.   HPI  4 review of systems  Past Medical History:  Diagnosis Date  . Diabetes ([place])   . History of colon polyps   . Hypertension     Current Outpatient Medications  Medication Sig Dispense Refill  . ACCU-CHEK SMARTVIEW test strip See admin instructions.  5  . acyclovir (ZOVIRAX) 200 MG capsule TK 1 C PO Q 4 H WHILE AWAKE  5  . aspirin 81 MG tablet Take 81 mg by mouth daily.    Marland Kitchen atenolol (TENORMIN) 25 MG tablet Take 2 tablets (50 mg total) by mouth daily. 60 tablet 0  . benzonatate (TESSALON) 100 MG capsule Take 1 capsule (100 mg total) by mouth every 8 (eight) hours. 21 capsule 0  . diclofenac sodium (VOLTAREN) 1 % GEL Apply 2 g topically 4 (four) times daily. 5 Tube 1  . ferrous sulfate 325 (65 FE) MG tablet Take 325 mg by mouth daily with breakfast.    . glimepiride (AMARYL) 2 MG tablet Take 2 mg by mouth daily with breakfast.    . lisinopril (PRINIVIL,ZESTRIL) 5 MG tablet Take 1 tablet (5 mg total) by mouth daily. 30 tablet 0  . loratadine (CLARITIN) 10 MG tablet Take 10 mg by mouth daily.    . metFORMIN (GLUCOPHAGE) 500 MG tablet Take 1 tablet (500 mg total) by mouth 2 (two) times daily with a meal. 60 tablet 0  . montelukast (SINGULAIR) 10 MG tablet Take 10 mg by mouth at bedtime.    . Multiple Vitamins-Minerals (CENTRUM SILVER PO) Take 1 tablet by mouth daily.    . naproxen (NAPROSYN) 500 MG tablet Take 500 mg by mouth 2 (two) times daily with a meal.    . Omega 3-6-9 Fatty Acids (OMEGA 3-6-9 COMPLEX PO) Take 1 capsule by mouth daily.    Marland Kitchen oseltamivir (TAMIFLU) 75 MG capsule Take 1 capsule (75 mg total) by mouth every 12 (twelve) hours. Take all of medication. (Patient not taking: Reported on 12/31/2015) 10 capsule 0  . oxyCODONE (ROXICODONE) 5 MG immediate release tablet Take 0.5 tablets (2.5 mg total) by mouth every 4 (four) hours as needed for severe pain. (Patient not taking: Reported on 12/31/2015) 5 tablet 0  . oxyCODONE-acetaminophen  (PERCOCET/ROXICET) 5-325 MG tablet Take 1 tablet by mouth every 4 (four) hours as needed for severe pain. (Patient not taking: Reported on 12/31/2015) 6 tablet 0  . ranitidine (ZANTAC) 300 MG capsule Take 300 mg by mouth every evening.     No current facility-administered medications for this visit.     Allergies: No Known Allergies  Past Surgical History:  Procedure Laterality Date  . BACK SURGERY      Social History   Socioeconomic History  . Marital status: Single    Spouse name: Not on file  . Number of children: 4  . Years of education: Not on file  . Highest education level: Not on file  Social Needs  . Financial resource strain: Not on file  . Food insecurity - worry: Not on file  . Food insecurity - inability: Not on file  . Transportation needs - medical: Not on file  . Transportation needs - non-medical: Not on file  Occupational History  . Not on file  Tobacco Use  . Smoking status: Never Smoker  . Smokeless tobacco: Never Used  Substance and Sexual Activity  . Alcohol use: No  . Drug use: No  . Sexual activity: Not on file  Other Topics Concern  .  Not on file  Social History Narrative  . Not on file    No family history on file.   ROS Review of Systems See HPI Constitution: No fevers or chills No malaise No diaphoresis Skin: No rash or itching Eyes: no blurry vision, no double vision GU: no dysuria or hematuria Neuro: no dizziness or headaches * all others reviewed and negative   Objective: There were no vitals filed for this visit.  Physical Exam  Assessment and Plan There are no diagnoses linked to this encounter.   Delores P [employer]

## 2018-05-26 ENCOUNTER — Encounter: Payer: Self-pay | Admitting: Gastroenterology

## 2018-07-29 ENCOUNTER — Encounter: Payer: Self-pay | Admitting: Gastroenterology

## 2018-08-11 ENCOUNTER — Ambulatory Visit: Payer: Medicare HMO

## 2018-08-11 ENCOUNTER — Other Ambulatory Visit: Payer: Self-pay

## 2018-08-11 VITALS — Ht 67.0 in | Wt 160.0 lb

## 2018-08-11 DIAGNOSIS — Z86010 Personal history of colonic polyps: Secondary | ICD-10-CM

## 2018-08-11 MED ORDER — NA SULFATE-K SULFATE-MG SULF 17.5-3.13-1.6 GM/177ML PO SOLN: 1.0000 | Freq: Once | ORAL | 0 refills | Status: AC

## 2018-08-11 NOTE — Progress Notes (Signed)
Nicole Kindred, the interpretor came into the office to help with the PV, which was done over the phone with the pt.  Per interpretor, no allergies to soy or egg products. Pt not taking any weight loss meds or using  O2 at home. Pt denies  problems with sedation.  Pt refused emmi video because of no email. I verified the pt;s address and insurance. Reviewed medical history and prep instructions with the assistance of the interpretor. I will mail the paperwork to the pt today. The pt was advised to call with any questions or changes prior to the procedure. He understood.

## 2018-08-23 ENCOUNTER — Telehealth: Payer: Self-pay | Admitting: Gastroenterology

## 2018-08-23 NOTE — Telephone Encounter (Signed)

## 2018-08-24 ENCOUNTER — Encounter: Payer: Self-pay | Admitting: Gastroenterology

## 2018-08-24 ENCOUNTER — Other Ambulatory Visit: Payer: Self-pay

## 2018-08-24 ENCOUNTER — Ambulatory Visit (AMBULATORY_SURGERY_CENTER): Payer: Medicare HMO | Admitting: Gastroenterology

## 2018-08-24 VITALS — BP 143/84 | HR 55 | Temp 97.6°F | Resp 11 | Ht 67.0 in | Wt 160.0 lb

## 2018-08-24 DIAGNOSIS — D128 Benign neoplasm of rectum: Secondary | ICD-10-CM

## 2018-08-24 DIAGNOSIS — D123 Benign neoplasm of transverse colon: Secondary | ICD-10-CM | POA: Diagnosis not present

## 2018-08-24 DIAGNOSIS — D124 Benign neoplasm of descending colon: Secondary | ICD-10-CM

## 2018-08-24 DIAGNOSIS — Z86010 Personal history of colonic polyps: Secondary | ICD-10-CM

## 2018-08-24 DIAGNOSIS — D122 Benign neoplasm of ascending colon: Secondary | ICD-10-CM

## 2018-08-24 HISTORY — PX: COLONOSCOPY: SHX174

## 2018-08-24 MED ORDER — SODIUM CHLORIDE 0.9 % IV SOLN: 500.0000 mL | Freq: Once | INTRAVENOUS | Status: DC

## 2018-08-24 NOTE — Progress Notes (Signed)
Pt's states no medical or surgical changes since previsit or office visit.  Temps taken by CW V/S taken by JB

## 2018-08-24 NOTE — Progress Notes (Signed)
[employer] transport

## 2018-08-24 NOTE — Patient Instructions (Signed)
Impression/recommendations:  Polyps (handout given)  YOU HAD AN ENDOSCOPIC PROCEDURE TODAY AT [place]:   Refer to the procedure report that was given to you for any specific questions about what was found during the examination.  If the procedure report does not answer your questions, please call your gastroenterologist to clarify.  If you requested that your care partner not be given the details of your procedure findings, then the procedure report has been included in a sealed envelope for you to review at your convenience later.  YOU SHOULD EXPECT: Some feelings of bloating in the abdomen. Passage of more gas than usual.  Walking can help get rid of the air that was put into your GI tract during the procedure and reduce the bloating. If you had a lower endoscopy (such as a colonoscopy or flexible sigmoidoscopy) you may notice spotting of blood in your stool or on the toilet paper. If you underwent a bowel prep for your procedure, you may not have a normal bowel movement for a few days.  Please Note:  You might notice some irritation and congestion in your nose or some drainage.  This is from the oxygen used during your procedure.  There is no need for concern and it should clear up in a day or so.  SYMPTOMS TO REPORT IMMEDIATELY:   Following lower endoscopy (colonoscopy or flexible sigmoidoscopy):  Excessive amounts of blood in the stool  Significant tenderness or worsening of abdominal pains  Swelling of the abdomen that is new, acute  Fever of 100F or higher   Following upper endoscopy (EGD)  Vomiting of blood or coffee ground material  New chest pain or pain under the shoulder blades  Painful or persistently difficult swallowing  New shortness of breath  Fever of 100F or higher  Black, tarry-looking stools  For urgent or emergent issues, a gastroenterologist can be reached at any hour by calling [phone].   DIET:  We do recommend a small meal at  first, but then you may proceed to your regular diet.  Drink plenty of fluids but you should avoid alcoholic beverages for 24 hours.  ACTIVITY:  You should plan to take it easy for the rest of today and you should NOT DRIVE or use heavy machinery until tomorrow (because of the sedation medicines used during the test).    FOLLOW UP: Our staff will call the number listed on your records 48-72 hours following your procedure to check on you and address any questions or concerns that you may have regarding the information given to you following your procedure. If we do not reach you, we will leave a message.  We will attempt to reach you two times.  During this call, we will ask if you have developed any symptoms of COVID 19. If you develop any symptoms (ie: fever, flu-like symptoms, shortness of breath, cough etc.) before then, please call [phone].  If you test positive for Covid 19 in the 2 weeks post procedure, please call and report this information to [place].    If any biopsies were taken you will be contacted by phone or by letter within the next 1-3 weeks.  Please call us at [phone] if you have not heard about the biopsies in 3 weeks.    SIGNATURES/CONFIDENTIALITY: You and/or your care partner have signed paperwork which will be entered into your electronic medical record.  These signatures attest to the fact that that the information above on your After Visit  Summary has been reviewed and is understood.  Full responsibility of the confidentiality of this discharge information lies with you and/or your care-partner. 

## 2018-08-24 NOTE — Progress Notes (Signed)
PT taken to PACU. Monitors in place. VSS. Report given to RN. 

## 2018-08-24 NOTE — Progress Notes (Signed)
Called to room to assist during endoscopic procedure.  Patient ID and intended procedure confirmed with present staff. Received instructions for my participation in the procedure from the performing physician.  

## 2018-08-24 NOTE — Op Note (Signed)
[place] [facility] Patient Name: Jesus [name] Procedure Date: 08/24/2018 8:01 AM MRN: [id number] Endoscopist: Milus Banister , MD Age: 68 Referring MD:  Date of Birth: 02-Jan-1951 Gender: Male Account #: [id number] Procedure:                Colonoscopy Indications:              High risk colon cancer surveillance: Personal                            history of colonic polyps;colonoscopy 2011 seven                            polyps removed, majority were adenomatous.                            Colonoscopy 2015 three polyps, one was adenomatous. Medicines:                Monitored Anesthesia Care Procedure:                Pre-Anesthesia Assessment:                           - Prior to the procedure, a History and Physical                            was performed, and patient medications and                            allergies were reviewed. The patient's tolerance of                            previous anesthesia was also reviewed. The risks                            and benefits of the procedure and the sedation                            options and risks were discussed with the patient.                            All questions were answered, and informed consent                            was obtained. Prior Anticoagulants: The patient has                            taken no previous anticoagulant or antiplatelet                            agents. ASA Grade Assessment: II - A patient with                            mild systemic disease. After reviewing the risks  and benefits, the patient was deemed in                            satisfactory condition to undergo the procedure.                           After obtaining informed consent, the colonoscope                            was passed under direct vision. Throughout the                            procedure, the patient's blood pressure, pulse, and                            oxygen saturations were  monitored continuously. The                            Model CF-HQ190L [id number]) scope was introduced                            through the anus and advanced to the the cecum,                            identified by appendiceal orifice and ileocecal                            valve. The colonoscopy was performed without                            difficulty. The patient tolerated the procedure                            well. The quality of the bowel preparation was                            good. The ileocecal valve, appendiceal orifice, and                            rectum were photographed. Scope In: 8:37:59 AM Scope Out: 8:53:25 AM Scope Withdrawal Time: 0 hours 13 minutes 26 seconds  Total Procedure Duration: 0 hours 15 minutes 26 seconds  Findings:                 Eight sessile polyps were found in the rectum,                            descending colon, transverse colon and ascending                            colon. The polyps were 2 to 7 mm in size. These                            polyps were removed with a cold snare. Resection  and retrieval were complete.                           The exam was otherwise without abnormality on                            direct and retroflexion views. Complications:            No immediate complications. Estimated blood loss:                            None. Estimated Blood Loss:     Estimated blood loss: none. Impression:               - Eight 2 to 7 mm polyps in the rectum, in the                            descending colon, in the transverse colon and in                            the ascending colon, removed with a cold snare.                            Resected and retrieved.                           - The examination was otherwise normal on direct                            and retroflexion views. Recommendation:           - Patient has a contact number available for                             emergencies. The signs and symptoms of potential                            delayed complications were discussed with the                            patient. Return to normal activities tomorrow.                            Written discharge instructions were provided to the                            patient.                           - Resume previous diet.                           - Continue present medications.                           You will receive a letter within 2-3 weeks with the  pathology results and my final recommendations.                           If the polyp(s) is proven to be 'pre-cancerous' on                            pathology, you will need repeat colonoscopy in 3                            years. Milus Banister, MD 08/24/2018 8:56:38 AM This report has been signed electronically.

## 2018-08-26 ENCOUNTER — Telehealth: Payer: Self-pay | Admitting: *Deleted

## 2018-08-26 ENCOUNTER — Telehealth: Payer: Self-pay

## 2018-08-26 NOTE — Telephone Encounter (Signed)
Second post procedure follow up call, no answer 

## 2018-08-26 NOTE — Telephone Encounter (Signed)
  Follow up Call-  Call back number 08/24/2018  Post procedure Call Back phone  # [phone]  Permission to leave phone message Yes  Some recent data might be hidden     Patient questions:  Message left to call us if necessary.

## 2018-08-29 ENCOUNTER — Encounter: Payer: Self-pay | Admitting: Gastroenterology

## 2019-12-18 ENCOUNTER — Ambulatory Visit: Payer: Medicaid Other | Attending: Internal Medicine

## 2019-12-18 ENCOUNTER — Other Ambulatory Visit (HOSPITAL_BASED_OUTPATIENT_CLINIC_OR_DEPARTMENT_OTHER): Payer: Self-pay | Admitting: Internal Medicine

## 2019-12-18 DIAGNOSIS — Z23 Encounter for immunization: Secondary | ICD-10-CM

## 2019-12-18 NOTE — Progress Notes (Signed)
   Covid-19 Vaccination Clinic  Name:  Jesus [name]    MRN: [id number] DOB: 06/09/1950  12/18/2019  Mr. Jesus [name] was observed post Covid-19 immunization for 15 minutes without incident. He was provided with Vaccine Information Sheet and instruction to access the V-Safe system. Vaccinated by Harriet Pho.  Mr. Jesus [name] was instructed to call 911 with any severe reactions post vaccine: Marland Kitchen Difficulty breathing  . Swelling of face and throat  . A fast heartbeat  . A bad rash all over body  . Dizziness and weakness

## 2020-01-31 ENCOUNTER — Encounter (HOSPITAL_BASED_OUTPATIENT_CLINIC_OR_DEPARTMENT_OTHER): Payer: Self-pay

## 2020-01-31 ENCOUNTER — Emergency Department (HOSPITAL_BASED_OUTPATIENT_CLINIC_OR_DEPARTMENT_OTHER)
Admission: EM | Admit: 2020-01-31 | Discharge: 2020-01-31 | Disposition: A | Discharge status: Home / Self Care | Payer: Medicare HMO | Attending: Emergency Medicine | Admitting: Emergency Medicine

## 2020-01-31 ENCOUNTER — Other Ambulatory Visit: Payer: Self-pay

## 2020-01-31 DIAGNOSIS — E1169 Type 2 diabetes mellitus with other specified complication: Secondary | ICD-10-CM | POA: Diagnosis not present

## 2020-01-31 DIAGNOSIS — L989 Disorder of the skin and subcutaneous tissue, unspecified: Secondary | ICD-10-CM | POA: Diagnosis not present

## 2020-01-31 DIAGNOSIS — H6123 Impacted cerumen, bilateral: Secondary | ICD-10-CM

## 2020-01-31 DIAGNOSIS — Z7982 Long term (current) use of aspirin: Secondary | ICD-10-CM | POA: Diagnosis not present

## 2020-01-31 DIAGNOSIS — Z79899 Other long term (current) drug therapy: Secondary | ICD-10-CM | POA: Insufficient documentation

## 2020-01-31 DIAGNOSIS — I10 Essential (primary) hypertension: Secondary | ICD-10-CM | POA: Diagnosis not present

## 2020-01-31 DIAGNOSIS — E782 Mixed hyperlipidemia: Secondary | ICD-10-CM | POA: Diagnosis not present

## 2020-01-31 DIAGNOSIS — Z7984 Long term (current) use of oral hypoglycemic drugs: Secondary | ICD-10-CM | POA: Diagnosis not present

## 2020-01-31 DIAGNOSIS — H9193 Unspecified hearing loss, bilateral: Secondary | ICD-10-CM | POA: Diagnosis present

## 2020-01-31 MED ORDER — DOCUSATE SODIUM 50 MG/5ML PO LIQD
ORAL | Status: AC
  Filled 2020-01-31: qty 10

## 2020-01-31 MED ORDER — DOCUSATE SODIUM 100 MG PO CAPS
100.0000 mg | ORAL_CAPSULE | Freq: Once | ORAL | Status: DC
  Filled 2020-01-31: qty 1

## 2020-01-31 NOTE — Discharge Instructions (Addendum)
Usted fue visto hoy por acumulacin de cerumen, use las instrucciones adjuntas. No se meta ningn objeto en los odos. Quiero que hagas un seguimiento con un dermatlogo por tu lesin cutnea, si no te llaman dentro de Jesus [name], pide a tu PCP que te haga una cita de dermatologa. Si tienes algn sntoma nuevo o que empeora, vuelve aqu.  You were seen today for earwax buildup, please use the attachted instructions. Do not stick any objects in your ears. I want you to follow up with a dermatologist for your skin lesion, if they do not call you within a week please have your PCP make a dermatology appointment for you.If you have any new or worsening concerning symptoms please come back here.

## 2020-01-31 NOTE — ED Triage Notes (Signed)
Pt c/o decreased hearing/both ears and a bump to left cheek-sx started yesterday-NAD-steady gait

## 2020-01-31 NOTE — ED Provider Notes (Signed)
[facility] EMERGENCY DEPARTMENT Provider Note   CSN: [id number] Arrival date & time: 01/31/20  1958     History Chief Complaint  Patient presents with  . Hearing Problem    Jesus [name] is a 69 y.o. male with pertinent past medical history of diabetes and hypertension that presents the emergency department today for bilateral hearing loss since yesterday and also a lesion to his left cheek for the last three weeks.  Patient is Spanish-speaking, he did not want to use medical interpreter at this time.  Patient states that he was cleaning his ears yesterday with a Q-tip, and he then noticed that he started having hearing loss on both sides.  Also hears a whooshing sound on both sides.  Denies any otalgia, tinnitus, dizziness, headache, otorrhea.  States that his ears were normal before this.  Denies any congestion, cough, sore throat, fevers, chills, nausea, vomiting.  Patient states that he also came to get his left-sided cheek checked out, states that he noticed a lesion forming on the past 3 weeks, has not changed in size or color.  States that it has bled once, no drainage.  Denies any bug bites to the area.  Denies any rash.  Has never had anything like this happen to him before.  Is often out in the sun.  HPI     Past Medical History:  Diagnosis Date  . Anemia    in the past  . Arthritis    in feet,knees  . Diabetes ([place])   . GERD (gastroesophageal reflux disease)   . History of colon polyps   . Hypertension   . Low back pain     Patient Active Problem List   Diagnosis Date Noted  . Right foot pain 12/31/2015  . Painful legs and moving toes of right foot 12/31/2015  . Chronic midline low back pain without sciatica 12/31/2015  . DIABETES MELLITUS-TYPE II 07/16/2009  . GERD 07/16/2009  . ABDOMINAL PAIN -GENERALIZED 07/16/2009  . OTHER NONSPECIFIC ABNORMAL SERUM ENZYME LEVELS 07/12/2009  . HYPERTENSION, UNSPECIFIED 02/05/2009  . HYPERLIPIDEMIA-MIXED  01/11/2009  . SHORTNESS OF BREATH 01/11/2009  . CHEST PAIN 01/11/2009    Past Surgical History:  Procedure Laterality Date  . BACK SURGERY     25 years ago       Family History  Problem Relation Age of Onset  . Anemia Mother   . Arthritis Mother   . Diabetes Father   . Hypertension Father   . Diabetes Sister   . Ovarian cancer Sister   . Diabetes Brother   . Other Sister   . Colon cancer Neg Hx   . Esophageal cancer Neg Hx   . Stomach cancer Neg Hx   . Rectal cancer Neg Hx     Social History   Tobacco Use  . Smoking status: Never Smoker  . Smokeless tobacco: Never Used  Vaping Use  . Vaping Use: Never used  Substance Use Topics  . Alcohol use: No  . Drug use: No    Home Medications Prior to Admission medications   Medication Sig Start Date End Date Taking? Authorizing Provider  ACCU-CHEK SMARTVIEW test strip See admin instructions. 10/17/15   [provider]  acyclovir (ZOVIRAX) 200 MG capsule TK 1 C PO Q 4 H WHILE AWAKE 11/14/15   [provider]  aspirin 81 MG tablet Take 81 mg by mouth daily.    [provider]  atenolol (TENORMIN) 25 MG tablet Take 2 tablets (  50 mg total) by mouth daily. Patient taking differently: Take 25 mg by mouth daily.  11/07/15   Lysbeth Penner, FNP  benzonatate (TESSALON) 100 MG capsule Take 1 capsule (100 mg total) by mouth every 8 (eight) hours. Patient not taking: Reported on 08/11/2018 08/06/15   Konrad Felix, PA  diclofenac sodium (VOLTAREN) 1 % GEL Apply 2 g topically 4 (four) times daily. Patient not taking: Reported on 08/11/2018 12/31/15   Biagio Borg D, PA-C  ferrous sulfate 325 (65 FE) MG tablet Take 325 mg by mouth daily with breakfast.    [provider]  glimepiride (AMARYL) 2 MG tablet Take 2 mg by mouth daily with breakfast.    [provider]  ibuprofen (ADVIL) 200 MG tablet Take 200 mg by mouth every 6 (six) hours as needed.    [provider]  lisinopril  (PRINIVIL,ZESTRIL) 5 MG tablet Take 1 tablet (5 mg total) by mouth daily. Patient taking differently: Take 5 mg by mouth at bedtime.  11/07/15   Lysbeth Penner, FNP  loratadine (CLARITIN) 10 MG tablet Take 10 mg by mouth daily.    [provider]  metFORMIN (GLUCOPHAGE) 500 MG tablet Take 1 tablet (500 mg total) by mouth 2 (two) times daily with a meal. Patient taking differently: Take 1,000 mg by mouth 2 (two) times daily with a meal. Take 1 gm in lunchtime and half pill at dinner 11/07/15   Lysbeth Penner, FNP  montelukast (SINGULAIR) 10 MG tablet Take 10 mg by mouth at bedtime.    [provider]  Multiple Vitamins-Minerals (CENTRUM SILVER PO) Take 1 tablet by mouth daily.    [provider]  naproxen (NAPROSYN) 500 MG tablet Take 500 mg by mouth 2 (two) times daily with a meal.    [provider]  Omega 3-6-9 Fatty Acids (OMEGA 3-6-9 COMPLEX PO) Take 1 capsule by mouth daily.    [provider]  omeprazole (PRILOSEC) 40 MG capsule Take 40 mg by mouth. Take one before dinner    [provider]  oseltamivir (TAMIFLU) 75 MG capsule Take 1 capsule (75 mg total) by mouth every 12 (twelve) hours. Take all of medication. Patient not taking: Reported on 12/31/2015 04/29/15   [name], MD  oxyCODONE (ROXICODONE) 5 MG immediate release tablet Take 0.5 tablets (2.5 mg total) by mouth every 4 (four) hours as needed for severe pain. Patient not taking: Reported on 12/31/2015 04/01/15   Deno Etienne, DO  oxyCODONE-acetaminophen (PERCOCET/ROXICET) 5-325 MG tablet Take 1 tablet by mouth every 4 (four) hours as needed for severe pain. Patient not taking: Reported on 12/31/2015 08/06/15   Konrad Felix, PA  pravastatin (PRAVACHOL) 10 MG tablet Take 10 mg by mouth daily.    [provider]  ranitidine (ZANTAC) 300 MG capsule Take 300 mg by mouth every evening.    [provider]  sertraline (ZOLOFT) 50 MG tablet Take 50 mg by mouth at  bedtime.    [provider]  tamsulosin (FLOMAX) 0.4 MG CAPS capsule Take 0.4 mg by mouth daily.    [provider]    Allergies    Patient has no known allergies.  Review of Systems   Review of Systems  Constitutional: Negative for chills, diaphoresis, fatigue and fever.  HENT: Positive for hearing loss. Negative for congestion, ear discharge, ear pain, rhinorrhea, sinus pressure, sinus pain, sore throat and trouble swallowing.   Eyes: Negative for pain and visual disturbance.  Respiratory:  Negative for cough, shortness of breath and wheezing.   Cardiovascular: Negative for chest pain, palpitations and leg swelling.  Gastrointestinal: Negative for abdominal distention, abdominal pain, diarrhea, nausea and vomiting.  Genitourinary: Negative for difficulty urinating.  Musculoskeletal: Negative for back pain, neck pain and neck stiffness.  Skin: Positive for wound. Negative for pallor.  Neurological: Negative for dizziness, speech difficulty, weakness and headaches.  Psychiatric/Behavioral: Negative for confusion.    Physical Exam Updated Vital Signs BP 130/67 (BP Location: Left Arm)   Pulse 60   Temp 98.7 F (37.1 C) (Oral)   Resp 14   Ht 5\' 7"  (1.702 m)   Wt 68 kg   SpO2 98%   BMI 23.49 kg/m   Physical Exam Constitutional:      General: He is not in acute distress.    Appearance: Normal appearance. He is not ill-appearing, toxic-appearing or diaphoretic.  HENT:     Head:      Comments: Patient with half a centimeter lesions to face, does appear raised and does have a darker center, does appear hard in the middle.  No true fluctuance.  No erythema surrounding it, no warmth.  Is not tender to touch.    Right Ear: Decreased hearing noted. No drainage, swelling or tenderness. There is impacted cerumen.     Left Ear: Decreased hearing noted. No drainage, swelling or tenderness. There is impacted cerumen.  Cardiovascular:     Rate and Rhythm: Normal rate and  regular rhythm.     Pulses: Normal pulses.  Pulmonary:     Effort: Pulmonary effort is normal.     Breath sounds: Normal breath sounds.  Musculoskeletal:        General: Normal range of motion.  Skin:    General: Skin is warm and dry.     Capillary Refill: Capillary refill takes less than 2 seconds.  Neurological:     General: No focal deficit present.     Mental Status: He is alert and oriented to person, place, and time.  Psychiatric:        Mood and Affect: Mood normal.        Behavior: Behavior normal.        Thought Content: Thought content normal.     ED Results / Procedures / Treatments   Labs (all labs ordered are listed, but only abnormal results are displayed) Labs Reviewed - No data to display  EKG None  Radiology No results found.  Procedures Procedures (including critical care time)  Medications Ordered in ED Medications  docusate sodium (COLACE) capsule 100 mg (has no administration in time range)  docusate (COLACE) 50 MG/5ML liquid (has no administration in time range)    ED Course  I have reviewed the triage vital signs and the nursing notes.  Pertinent labs & imaging results that were available during my care of the patient were reviewed by me and considered in my medical decision making (see chart for details).    MDM Rules/Calculators/A&P                         D[place]un [place] [name] is a 69 y.o. male with pertinent past medical history of diabetes and hypertension that presents the emergency department today for bilateral hearing loss since yesterday and also a lesion to his left cheek for the last three weeks.Pt with bilateral cerumen impaction, after ears were irrigated pt states that he feels much better, hearing has improved. Upon evaluation, TMs look  normal. IN regards to skin lesion on face, will have patient follow-up with dermatology, differential to include squamous skin carcinoma, do not think this is abscess at this time.  Doubt need  for further emergent work up at this time. I explained the diagnosis and have given explicit precautions to return to the ER including for any other new or worsening symptoms. The patient understands and accepts the medical plan as it's been dictated and I have answered their questions. Discharge instructions concerning home care and prescriptions have been given. The patient is STABLE and is discharged to home in good condition.  I discussed this case with my attending physician who cosigned this note including patient's presenting symptoms, physical exam, and planned diagnostics and interventions. Attending physician stated agreement with plan or made changes to plan which were implemented.  Final Clinical Impression(s) / ED Diagnoses Final diagnoses:  Skin lesion  Bilateral impacted cerumen    Rx / DC Orders ED Discharge Orders         Ordered    Ambulatory referral to Dermatology        01/31/20 2238           Alfredia Client, PA-C 01/31/20 [id number]    Gareth Morgan, MD 02/01/20 1112

## 2020-12-05 ENCOUNTER — Ambulatory Visit: Payer: Medicare HMO | Admitting: Medical

## 2021-05-31 ENCOUNTER — Encounter (HOSPITAL_BASED_OUTPATIENT_CLINIC_OR_DEPARTMENT_OTHER): Payer: Self-pay | Admitting: Emergency Medicine

## 2021-05-31 ENCOUNTER — Other Ambulatory Visit: Payer: Self-pay

## 2021-05-31 ENCOUNTER — Emergency Department (HOSPITAL_BASED_OUTPATIENT_CLINIC_OR_DEPARTMENT_OTHER)
Admission: EM | Admit: 2021-05-31 | Discharge: 2021-05-31 | Disposition: A | Discharge status: Home / Self Care | Payer: Medicare Other | Attending: Emergency Medicine | Admitting: Emergency Medicine

## 2021-05-31 DIAGNOSIS — Z20822 Contact with and (suspected) exposure to covid-19: Secondary | ICD-10-CM | POA: Insufficient documentation

## 2021-05-31 DIAGNOSIS — Z7982 Long term (current) use of aspirin: Secondary | ICD-10-CM | POA: Insufficient documentation

## 2021-05-31 DIAGNOSIS — Z79899 Other long term (current) drug therapy: Secondary | ICD-10-CM | POA: Insufficient documentation

## 2021-05-31 DIAGNOSIS — J069 Acute upper respiratory infection, unspecified: Secondary | ICD-10-CM | POA: Insufficient documentation

## 2021-05-31 DIAGNOSIS — R059 Cough, unspecified: Secondary | ICD-10-CM | POA: Diagnosis present

## 2021-05-31 DIAGNOSIS — J029 Acute pharyngitis, unspecified: Secondary | ICD-10-CM

## 2021-05-31 LAB — RESP PANEL BY RT-PCR (FLU A&B, COVID) ARPGX2
Influenza A by PCR: NEGATIVE
Influenza B by PCR: NEGATIVE
SARS Coronavirus 2 by RT PCR: NEGATIVE

## 2021-05-31 LAB — GROUP A STREP BY PCR: Group A Strep by PCR: NOT DETECTED

## 2021-05-31 MED ORDER — BENZONATATE 100 MG PO CAPS: 100.0000 mg | ORAL_CAPSULE | Freq: Three times a day (TID) | ORAL | 0 refills | Status: DC

## 2021-05-31 NOTE — ED Provider Notes (Signed)
?[facility] ?Provider Note ? ? ?CSN: [id number] ?Arrival date & time: 05/31/21  1450 ? ?  ? ?History ? ?Chief Complaint  ?Patient presents with  ? Sore Throat  ? Cough  ? ? ?Jesus [name] is a 71 y.o. male who presents to the ED for evaluation of sore throat and cough that has been ongoing for 1 week.  Patient has been taking over-the-counter cold and flu medication with temporary relief, however he is concerned that his symptoms have not resolved at this point.  He is otherwise feeling fine denies headache, fevers, chest pain, abdominal pain, nausea, vomiting and diarrhea. ? ? ?Sore Throat ? ?Cough ? ?  ? ?Home Medications ?Prior to Admission medications   ?Medication Sig Start Date End Date Taking? Authorizing Provider  ?benzonatate (TESSALON) 100 MG capsule Take 1 capsule (100 mg total) by mouth every 8 (eight) hours. 05/31/21  Yes Tonye Pearson, PA-C  ?ACCU-CHEK SMARTVIEW test strip See admin instructions. 10/17/15   [provider]  ?acyclovir (ZOVIRAX) 200 MG capsule TK 1 C PO Q 4 H WHILE AWAKE 11/14/15   [provider]  ?aspirin 81 MG tablet Take 81 mg by mouth daily.    [provider]  ?atenolol (TENORMIN) 25 MG tablet Take 2 tablets (50 mg total) by mouth daily. ?Patient taking differently: Take 25 mg by mouth daily.  11/07/15   Lysbeth Penner, FNP  ?benzonatate (TESSALON) 100 MG capsule Take 1 capsule (100 mg total) by mouth every 8 (eight) hours. ?Patient not taking: Reported on 08/11/2018 08/06/15   Konrad Felix, PA  ?diclofenac sodium (VOLTAREN) 1 % GEL Apply 2 g topically 4 (four) times daily. ?Patient not taking: Reported on 08/11/2018 12/31/15   Cherylann Ratel, PA-C  ?ferrous sulfate 325 (65 FE) MG tablet Take 325 mg by mouth daily with breakfast.    [provider]  ?glimepiride (AMARYL) 2 MG tablet Take 2 mg by mouth daily with breakfast.    [provider]  ?ibuprofen (ADVIL) 200 MG tablet Take 200 mg by  mouth every 6 (six) hours as needed.    [provider]  ?lisinopril (PRINIVIL,ZESTRIL) 5 MG tablet Take 1 tablet (5 mg total) by mouth daily. ?Patient taking differently: Take 5 mg by mouth at bedtime.  11/07/15   Lysbeth Penner, FNP  ?loratadine (CLARITIN) 10 MG tablet Take 10 mg by mouth daily.    [provider]  ?metFORMIN (GLUCOPHAGE) 500 MG tablet Take 1 tablet (500 mg total) by mouth 2 (two) times daily with a meal. ?Patient taking differently: Take 1,000 mg by mouth 2 (two) times daily with a meal. Take 1 gm in lunchtime and half pill at dinner 11/07/15   Lysbeth Penner, FNP  ?montelukast (SINGULAIR) 10 MG tablet Take 10 mg by mouth at bedtime.    [provider]  ?Multiple Vitamins-Minerals (CENTRUM SILVER PO) Take 1 tablet by mouth daily.    [provider]  ?naproxen (NAPROSYN) 500 MG tablet Take 500 mg by mouth 2 (two) times daily with a meal.    [provider]  ?Omega 3-6-9 Fatty Acids (OMEGA 3-6-9 COMPLEX PO) Take 1 capsule by mouth daily.    [provider]  ?omeprazole (PRILOSEC) 40 MG capsule Take 40 mg by mouth. Take one before dinner    [provider]  ?oseltamivir (TAMIFLU) 75 MG capsule Take 1 capsule (75 mg total) by mouth every 12 (twelve) hours. Take all of medication. ?  Patient not taking: Reported on 12/31/2015 04/29/15   [name], MD  ?oxyCODONE (ROXICODONE) 5 MG immediate release tablet Take 0.5 tablets (2.5 mg total) by mouth every 4 (four) hours as needed for severe pain. ?Patient not taking: Reported on 12/31/2015 04/01/15   Deno Etienne, DO  ?oxyCODONE-acetaminophen (PERCOCET/ROXICET) 5-325 MG tablet Take 1 tablet by mouth every 4 (four) hours as needed for severe pain. ?Patient not taking: Reported on 12/31/2015 08/06/15   Konrad Felix, PA  ?pravastatin (PRAVACHOL) 10 MG tablet Take 10 mg by mouth daily.    [provider]  ?ranitidine (ZANTAC) 300 MG capsule Take 300 mg by mouth every evening.     [provider]  ?sertraline (ZOLOFT) 50 MG tablet Take 50 mg by mouth at bedtime.    [provider]  ?tamsulosin (FLOMAX) 0.4 MG CAPS capsule Take 0.4 mg by mouth daily.    [provider]  ?   ? ?Allergies    ?Patient has no known allergies.   ? ?Review of Systems   ?Review of Systems  ?Respiratory:  Positive for cough.   ? ?Physical Exam ?Updated Vital Signs ?BP 117/66   Pulse 67   Temp 98.6 ?F (37 ?C) (Oral)   Resp 16   SpO2 96%  ?Physical Exam ?Vitals and nursing note reviewed.  ?Constitutional:   ?   General: He is not in acute distress. ?   Appearance: He is not ill-appearing.  ?HENT:  ?   Head: Atraumatic.  ?   Mouth/Throat:  ?   Pharynx: Posterior oropharyngeal erythema present.  ?   Tonsils: 0 on the right. 0 on the left.  ?   Comments: No unilateral swelling, negative uvular deviation ?Eyes:  ?   Conjunctiva/sclera: Conjunctivae normal.  ?Cardiovascular:  ?   Rate and Rhythm: Normal rate and regular rhythm.  ?   Pulses: Normal pulses.  ?   Heart sounds: No murmur heard. ?Pulmonary:  ?   Effort: Pulmonary effort is normal. No respiratory distress.  ?   Breath sounds: Normal breath sounds.  ?Abdominal:  ?   General: Abdomen is flat. There is no distension.  ?   Palpations: Abdomen is soft.  ?   Tenderness: There is no abdominal tenderness.  ?Musculoskeletal:     ?   General: Normal range of motion.  ?   Cervical back: Normal range of motion.  ?Lymphadenopathy:  ?   Cervical: No cervical adenopathy.  ?Skin: ?   General: Skin is warm and dry.  ?   Capillary Refill: Capillary refill takes less than 2 seconds.  ?Neurological:  ?   General: No focal deficit present.  ?   Mental Status: He is alert.  ?Psychiatric:     ?   Mood and Affect: Mood normal.  ? ? ?ED Results / Procedures / Treatments   ?Labs ?(all labs ordered are listed, but only abnormal results are displayed) ?Labs Reviewed  ?GROUP A STREP BY PCR  ?RESP PANEL BY RT-PCR (FLU A&B, COVID) [id number]   ? ? ?EKG ?None ? ?Radiology ?No results found. ? ?Procedures ?Procedures  ? ?Medications Ordered in ED ?Medications - No data to display ? ?ED Course/ Medical Decision Making/ A&P ?  ?                        ?Medical Decision Making ?Risk ?Prescription drug management. ? ? ?History:  ?Per HPI ?Social determinants of health: none ? ?Initial impression: ? ?  This patient presents to the ED for concern of sore throat, this involves an extensive number of treatment options, and is a complaint that carries with it a high risk of complications and morbidity.    ?Patient is overall well-appearing 71 year old male in no acute distress.  Vitals are normal, he is afebrile.  He is resting comfortably in bed.  Physical exam was overall benign aside from mild pharyngeal erythema.  Pending, COVID flu and strep test. ? ? ?Lab Tests and EKG: ? ?I Ordered, reviewed, and interpreted labs and EKG.  The pertinent results include:  ?Covid, flu and strep negative ? ? ?Disposition: ? ?After consideration of the diagnostic results, physical exam, history and the patients response to treatment feel that the patent would benefit from discharge.   ?Viral URI ?Sore throat: Pt requests medication for his cough. Tessalon prescribed. All vitals are normal, looks well on exam. No concern for peritonsillar abscess or other pathology for sore throat given clinical impression. All questions asked and answered. Return precautions discussed. Discharged home in good condition. ? ? ?Final Clinical Impression(s) / ED Diagnoses ?Final diagnoses:  ?Viral upper respiratory tract infection  ?Sore throat  ? ? ?Rx / DC Orders ?ED Discharge Orders   ? ?      Ordered  ?  benzonatate (TESSALON) 100 MG capsule  Every 8 hours       ? 05/31/21 1628  ? ?  ?  ? ?  ? ? ?  ?Tonye Pearson, [place] ?05/31/21 1715 ? ?  ?Drenda Freeze, MD ?05/31/21 2252 ? ?

## 2021-05-31 NOTE — Discharge Instructions (Addendum)
Your COVID, flu and strep test were all negative today.  Your symptoms will likely resolve within the next week or so.  Per your request, I have sent you in a prescription of cough medication called Tessalon which you can use as needed.  If your symptoms do not improve after another week or so, please follow up with your PCP. ?

## 2021-05-31 NOTE — ED Triage Notes (Signed)
Pt c/o sore throat and cough x 1 week. Denies fevers.  ?

## 2021-06-30 ENCOUNTER — Ambulatory Visit (HOSPITAL_COMMUNITY)
Admission: EM | Admit: 2021-06-30 | Discharge: 2021-06-30 | Disposition: A | Discharge status: Home / Self Care | Payer: Medicare Other | Attending: Sports Medicine | Admitting: Sports Medicine

## 2021-06-30 ENCOUNTER — Encounter (HOSPITAL_COMMUNITY): Payer: Self-pay | Admitting: Emergency Medicine

## 2021-06-30 ENCOUNTER — Ambulatory Visit (INDEPENDENT_AMBULATORY_CARE_PROVIDER_SITE_OTHER): Payer: Medicare Other

## 2021-06-30 DIAGNOSIS — R0981 Nasal congestion: Secondary | ICD-10-CM

## 2021-06-30 DIAGNOSIS — J0110 Acute frontal sinusitis, unspecified: Secondary | ICD-10-CM | POA: Diagnosis not present

## 2021-06-30 DIAGNOSIS — R059 Cough, unspecified: Secondary | ICD-10-CM

## 2021-06-30 DIAGNOSIS — R051 Acute cough: Secondary | ICD-10-CM

## 2021-06-30 DIAGNOSIS — R509 Fever, unspecified: Secondary | ICD-10-CM | POA: Diagnosis not present

## 2021-06-30 MED ORDER — BENZONATATE 100 MG PO CAPS: 100.0000 mg | ORAL_CAPSULE | Freq: Three times a day (TID) | ORAL | 0 refills | Status: DC

## 2021-06-30 MED ORDER — AZITHROMYCIN 250 MG PO TABS: 250.0000 mg | ORAL_TABLET | Freq: Every day | ORAL | 0 refills | Status: AC

## 2021-06-30 NOTE — ED Triage Notes (Addendum)
Pt has cough that is productive, fevers, headache, and chills that started yesterday.  ?

## 2021-06-30 NOTE — ED Provider Notes (Signed)
?[facility] ? ? ? ?CSN: [id number] ?Arrival date & time: 06/30/21  1231 ? ? ?  ? ?History   ?Chief Complaint ?Chief Complaint  ?Patient presents with  ? Cough  ? Fever  ? Headache  ? ? ?HPI ?Jesus [name] is a 71 y.o. male here for cough and fever. ? ?Ipad spanish interpreter used throughout entirety of visit. ? ? ?Cough ?Associated symptoms: chills and fever   ?Associated symptoms: no chest pain, no headaches, no myalgias, no rash, no sore throat and no wheezing   ?Fever ?Associated symptoms: chills, congestion and cough   ?Associated symptoms: no chest pain, no headaches, no myalgias, no rash and no sore throat   ?Headache ?Associated symptoms: congestion, cough, fever and sinus pressure   ?Associated symptoms: no dizziness, no fatigue, no myalgias and no sore throat   ? ?Cough and fever started yesterday, chest felt feverish, no true fever taken at home ?No chest pain  ?Some shortness of breath when he is coughing, but nothing continued.  No dyspnea on exertion.  Has a history of some shortness of breath, does not feel worse than usual. ?Cough - mostly dry, sometimes phlegm ?No abdominal pain ?Subjective chills and body aches  ?Runny nose and congestion as well. ?No difficulty swallowing. ? ?Sick contacts: Wife is also having similar symptoms. She is feeling better though.  She was not evaluated by a physician. ? ?Was seen in ED on 05/31/21 and diagnosed with viral URI and cough. Feels like symptoms Got somewhat better but never all the way. ? ?Tx: Nyquil - helps ? ?Past Medical History:  ?Diagnosis Date  ? Anemia   ? in the past  ? Arthritis   ? in feet,knees  ? Diabetes ([place])   ? GERD (gastroesophageal reflux disease)   ? History of colon polyps   ? Hypertension   ? Low back pain   ? ? ?Patient Active Problem List  ? Diagnosis Date Noted  ? Right foot pain 12/31/2015  ? Painful legs and moving toes of right foot 12/31/2015  ? Chronic midline low back pain without sciatica 12/31/2015  ?  DIABETES MELLITUS-TYPE II 07/16/2009  ? GERD 07/16/2009  ? ABDOMINAL PAIN -GENERALIZED 07/16/2009  ? OTHER NONSPECIFIC ABNORMAL SERUM ENZYME LEVELS 07/12/2009  ? HYPERTENSION, UNSPECIFIED 02/05/2009  ? HYPERLIPIDEMIA-MIXED 01/11/2009  ? SHORTNESS OF BREATH 01/11/2009  ? CHEST PAIN 01/11/2009  ? ? ?Past Surgical History:  ?Procedure Laterality Date  ? BACK SURGERY    ? 25 years ago  ? ? ? ? ? ?Home Medications   ? ?Prior to Admission medications   ?Medication Sig Start Date End Date Taking? Authorizing Provider  ?azithromycin (ZITHROMAX Z-PAK) 250 MG tablet Take 1 tablet (250 mg total) by mouth daily for 5 days. Take 2 tablets on day one, then one tablet every day from then on 06/30/21 07/05/21 Yes Elba Barman, DO  ?ACCU-CHEK SMARTVIEW test strip See admin instructions. 10/17/15   [provider]  ?acyclovir (ZOVIRAX) 200 MG capsule TK 1 C PO Q 4 H WHILE AWAKE 11/14/15   [provider]  ?aspirin 81 MG tablet Take 81 mg by mouth daily.    [provider]  ?atenolol (TENORMIN) 25 MG tablet Take 2 tablets (50 mg total) by mouth daily. ?Patient taking differently: Take 25 mg by mouth daily.  11/07/15   Lysbeth Penner, FNP  ?benzonatate (TESSALON) 100 MG capsule Take 1 capsule (100 mg total) by mouth every 8 (eight) hours. 06/30/21  Elba Barman, DO  ?diclofenac sodium (VOLTAREN) 1 % GEL Apply 2 g topically 4 (four) times daily. ?Patient not taking: Reported on 08/11/2018 12/31/15   Cherylann Ratel, PA-C  ?ferrous sulfate 325 (65 FE) MG tablet Take 325 mg by mouth daily with breakfast.    [provider]  ?glimepiride (AMARYL) 2 MG tablet Take 2 mg by mouth daily with breakfast.    [provider]  ?ibuprofen (ADVIL) 200 MG tablet Take 200 mg by mouth every 6 (six) hours as needed.    [provider]  ?lisinopril (PRINIVIL,ZESTRIL) 5 MG tablet Take 1 tablet (5 mg total) by mouth daily. ?Patient taking differently: Take 5 mg by mouth at bedtime.  11/07/15   Lysbeth Penner, FNP  ?loratadine (CLARITIN) 10 MG tablet Take 10 mg by mouth daily.    [provider]  ?metFORMIN (GLUCOPHAGE) 500 MG tablet Take 1 tablet (500 mg total) by mouth 2 (two) times daily with a meal. ?Patient taking differently: Take 1,000 mg by mouth 2 (two) times daily with a meal. Take 1 gm in lunchtime and half pill at dinner 11/07/15   Lysbeth Penner, FNP  ?montelukast (SINGULAIR) 10 MG tablet Take 10 mg by mouth at bedtime.    [provider]  ?Multiple Vitamins-Minerals (CENTRUM SILVER PO) Take 1 tablet by mouth daily.    [provider]  ?naproxen (NAPROSYN) 500 MG tablet Take 500 mg by mouth 2 (two) times daily with a meal.    [provider]  ?Omega 3-6-9 Fatty Acids (OMEGA 3-6-9 COMPLEX PO) Take 1 capsule by mouth daily.    [provider]  ?omeprazole (PRILOSEC) 40 MG capsule Take 40 mg by mouth. Take one before dinner    [provider]  ?oseltamivir (TAMIFLU) 75 MG capsule Take 1 capsule (75 mg total) by mouth every 12 (twelve) hours. Take all of medication. ?Patient not taking: Reported on 12/31/2015 04/29/15   [name], MD  ?oxyCODONE (ROXICODONE) 5 MG immediate release tablet Take 0.5 tablets (2.5 mg total) by mouth every 4 (four) hours as needed for severe pain. ?Patient not taking: Reported on 12/31/2015 04/01/15   Deno Etienne, DO  ?oxyCODONE-acetaminophen (PERCOCET/ROXICET) 5-325 MG tablet Take 1 tablet by mouth every 4 (four) hours as needed for severe pain. ?Patient not taking: Reported on 12/31/2015 08/06/15   Konrad Felix, PA  ?pravastatin (PRAVACHOL) 10 MG tablet Take 10 mg by mouth daily.    [provider]  ?ranitidine (ZANTAC) 300 MG capsule Take 300 mg by mouth every evening.    [provider]  ?sertraline (ZOLOFT) 50 MG tablet Take 50 mg by mouth at bedtime.    [provider]  ?tamsulosin (FLOMAX) 0.4 MG CAPS capsule Take 0.4 mg by mouth daily.    [provider]  ? ? ?Family  History ?Family History  ?Problem Relation Age of Onset  ? Anemia Mother   ? Arthritis Mother   ? Diabetes Father   ? Hypertension Father   ? Diabetes Sister   ? Ovarian cancer Sister   ? Diabetes Brother   ? Other Sister   ? Colon cancer Neg Hx   ? Esophageal cancer Neg Hx   ? Stomach cancer Neg Hx   ? Rectal cancer Neg Hx   ? ? ?Social History ?Social History  ? ?Tobacco Use  ? Smoking status: Never  ? Smokeless tobacco: Never  ?Vaping Use  ? Vaping Use: Never used  ?Substance Use  Topics  ? Alcohol use: No  ? Drug use: No  ? ? ? ?Allergies   ?Patient has no known allergies. ? ? ?Review of Systems ?Review of Systems  ?Constitutional:  Positive for chills and fever. Negative for fatigue.  ?HENT:  Positive for congestion and sinus pressure. Negative for sore throat and trouble swallowing.   ?Respiratory:  Positive for cough. Negative for wheezing.   ?Cardiovascular:  Negative for chest pain.  ?Musculoskeletal:  Negative for myalgias.  ?Skin:  Negative for rash.  ?Neurological:  Negative for dizziness and headaches.  ? ? ?Physical Exam ?Triage Vital Signs ?ED Triage Vitals  ?Enc Vitals Group  ?   BP 06/30/21 1314 114/75  ?   Pulse Rate 06/30/21 1314 70  ?   Resp 06/30/21 1314 19  ?   Temp 06/30/21 1314 98.8 ?F (37.1 ?C)  ?   Temp Source 06/30/21 1314 Oral  ?   SpO2 06/30/21 1314 96 %  ?   Weight --   ?   Height --   ?   Head Circumference --   ?   Peak Flow --   ?   Pain Score 06/30/21 1313 3  ?   Pain Loc --   ?   Pain Edu? --   ?   Excl. in [place]? --   ? ?No data found. ? ?Updated Vital Signs ?BP 114/75 (BP Location: Right Arm)   Pulse 70   Temp 98.8 ?F (37.1 ?C) (Oral)   Resp 19   SpO2 96%  ? ?Physical Exam ?Constitutional:   ?   General: He is not in acute distress. ?   Appearance: He is not toxic-appearing.  ?HENT:  ?   Head: Normocephalic.  ?   Comments: + sinus pressure in frontal sinuses b/l  ?   Nose: Congestion present.  ?   Mouth/Throat:  ?   Mouth: Mucous membranes are moist.  ?   Pharynx: No  oropharyngeal exudate or posterior oropharyngeal erythema.  ?Eyes:  ?   Extraocular Movements: Extraocular movements intact.  ?   Pupils: Pupils are equal, round, and reactive to light.  ?Cardiovascular:  ?   Rate and Rhythm: Constance Holster

## 2021-06-30 NOTE — Discharge Instructions (Addendum)
Tessalon perles for cough ? ?Azithromycin --> take 2 tablets of this antibiotic today, then take 1 tablet every day for the next 4 days for a total of 5 days ? ? ?

## 2021-07-25 ENCOUNTER — Encounter: Payer: Self-pay | Admitting: Gastroenterology

## 2021-08-19 ENCOUNTER — Other Ambulatory Visit: Payer: Self-pay | Admitting: Internal Medicine

## 2021-08-19 ENCOUNTER — Ambulatory Visit
Admission: RE | Admit: 2021-08-19 | Discharge: 2021-08-19 | Disposition: A | Discharge status: Home / Self Care | Payer: Medicare Other | Source: Ambulatory Visit | Attending: Internal Medicine | Admitting: Internal Medicine

## 2021-08-19 DIAGNOSIS — G8929 Other chronic pain: Secondary | ICD-10-CM

## 2021-11-03 ENCOUNTER — Encounter: Payer: Self-pay | Admitting: Gastroenterology

## 2021-11-13 ENCOUNTER — Ambulatory Visit: Payer: Medicare Other | Admitting: *Deleted

## 2021-11-13 VITALS — Ht 67.0 in | Wt 160.0 lb

## 2021-11-13 DIAGNOSIS — Z86010 Personal history of colonic polyps: Secondary | ICD-10-CM

## 2021-11-13 MED ORDER — NA SULFATE-K SULFATE-MG SULF 17.5-3.13-1.6 GM/177ML PO SOLN: 1.0000 | Freq: Once | ORAL | 0 refills | Status: AC

## 2021-11-13 NOTE — Progress Notes (Signed)
No egg or soy allergy known to patient  No issues known to pt with past sedation with any surgeries or procedures Patient denies ever being told they had issues or difficulty with intubation  No FH of Malignant Hyperthermia Pt is not on diet pills Pt is not on  home 02  Pt is not on blood thinners  Pt denies issues with constipation  No A fib or A flutter Have any cardiac testing pending--no Pt instructed to use [email or web address] or GoodRx for a price reduction on prep   Visit completed with help of interpreter [facility]

## 2021-12-10 ENCOUNTER — Encounter: Payer: Self-pay | Admitting: Gastroenterology

## 2021-12-10 ENCOUNTER — Ambulatory Visit (AMBULATORY_SURGERY_CENTER): Payer: Medicare Other | Admitting: Gastroenterology

## 2021-12-10 VITALS — BP 131/77 | HR 59 | Temp 98.4°F | Resp 14 | Ht 67.0 in | Wt 160.0 lb

## 2021-12-10 DIAGNOSIS — Z538 Procedure and treatment not carried out for other reasons: Secondary | ICD-10-CM

## 2021-12-10 DIAGNOSIS — Z09 Encounter for follow-up examination after completed treatment for conditions other than malignant neoplasm: Secondary | ICD-10-CM | POA: Diagnosis not present

## 2021-12-10 DIAGNOSIS — Z86010 Personal history of colonic polyps: Secondary | ICD-10-CM | POA: Diagnosis not present

## 2021-12-10 MED ORDER — NA SULFATE-K SULFATE-MG SULF 17.5-3.13-1.6 GM/177ML PO SOLN: 1.0000 | Freq: Once | ORAL | 0 refills | Status: AC

## 2021-12-10 MED ORDER — SODIUM CHLORIDE 0.9 % IV SOLN: 500.0000 mL | Freq: Once | INTRAVENOUS | Status: DC

## 2021-12-10 NOTE — Progress Notes (Signed)
With help of interpreter, Sheppard Coil, pt was given discharge instructions and future instructions for upcoming appt with 2 day prep

## 2021-12-10 NOTE — Op Note (Signed)
[facility] Patient Name: Jesus [name] Procedure Date: 12/10/2021 2:31 PM MRN: [id number] Endoscopist: Justice Britain , MD Age: 71 Referring MD:  Date of Birth: [birth year] Gender: Male Account #: [id number] Procedure:                Colonoscopy Indications:              Surveillance: Personal history of adenomatous                            polyps on last colonoscopy > 5 years ago Medicines:                Monitored Anesthesia Care Procedure:                Pre-Anesthesia Assessment:                           - Prior to the procedure, a History and Physical                            was performed, and patient medications and                            allergies were reviewed. The patient's tolerance of                            previous anesthesia was also reviewed. The risks                            and benefits of the procedure and the sedation                            options and risks were discussed with the patient.                            All questions were answered, and informed consent                            was obtained. Prior Anticoagulants: The patient has                            taken no previous anticoagulant or antiplatelet                            agents except for aspirin. ASA Grade Assessment: II                            - A patient with mild systemic disease. After                            reviewing the risks and benefits, the patient was                            deemed in satisfactory condition to undergo the  procedure.                           After obtaining informed consent, the colonoscope                            was passed under direct vision. Throughout the                            procedure, the patient's blood pressure, pulse, and                            oxygen saturations were monitored continuously. The                            Olympus CF-[id number]) Colonoscope  was                            introduced through the anus and advanced to the the                            cecum, identified by appendiceal orifice and                            ileocecal valve. The colonoscopy was performed                            without difficulty. The patient tolerated the                            procedure. The quality of the bowel preparation was                            poor. The ileocecal valve, appendiceal orifice, and                            rectum were photographed. Scope In: 2:56:27 PM Scope Out: 3:01:11 PM Scope Withdrawal Time: 0 hours 1 minute 39 seconds  Total Procedure Duration: 0 hours 4 minutes 44 seconds  Findings:                 The digital rectal exam was normal. Pertinent                            negatives include no palpable rectal lesions.                           Extensive amounts of semi-liquid semi-solid stool                            was found in the entire colon, precluding                            visualization. Lavage of the area was performed  using copious amounts, resulting in incomplete                            clearance with continued poor visualization.                           Non-bleeding non-thrombosed internal hemorrhoids                            were found during retroflexion, during perianal                            exam and during digital exam. The hemorrhoids were                            Grade I (internal hemorrhoids that do not prolapse). Complications:            No immediate complications. Estimated Blood Loss:     Estimated blood loss: none. Impression:               - Preparation of the colon was poor.                           - Stool in the entire examined colon.                           - Non-bleeding non-thrombosed internal hemorrhoids. Recommendation:           - The patient will be observed post-procedure,                            until all discharge  criteria are met.                           - Discharge patient to home.                           - Patient has a contact number available for                            emergencies. The signs and symptoms of potential                            delayed complications were discussed with the                            patient. Return to normal activities tomorrow.                            Written discharge instructions were provided to the                            patient.                           - Will discuss with patient and wife whether they  would want to do a second day of preparation in                            effort of performing colonoscopy tomorrow with                            another provider. Otherwise he can be rescheduled                            with me for a 2-day preparation in the next 3 to 6                            months.                           - Continue present medications.                           - The findings and recommendations were discussed                            with the patient.                           - The findings and recommendations were discussed                            with the patient's family. Justice Britain, MD 12/10/2021 3:07:07 PM

## 2021-12-10 NOTE — Patient Instructions (Addendum)
USTED TUVO UN PROCEDIMIENTO ENDOSCPICO HOY EN EL  ENDOSCOPY CENTER:   Lea el informe del procedimiento que se le entreg para cualquier pregunta especfica sobre lo que se Primary school teacher.  Si el informe del examen no responde a sus preguntas, por favor llame a su gastroenterlogo para aclararlo.  Si usted solicit que no se le den [employer] de lo que se Estate manager/land agent en su procedimiento al [employer] va a cuidar, entonces el informe del procedimiento se ha incluido en un sobre sellado para que usted lo revise despus cuando le sea ms conveniente.   LO QUE PUEDE ESPERAR: Algunas sensaciones de hinchazn en el abdomen.  Puede tener ms gases de lo normal.  El caminar puede ayudarle a eliminar el aire que se le puso en el tracto gastrointestinal durante el procedimiento y reducir la hinchazn.  Si le hicieron una endoscopia inferior (como una colonoscopia o una sigmoidoscopia flexible), podra notar manchas de sangre en las heces fecales o en el papel higinico.  Si se someti a una preparacin intestinal para su procedimiento, es posible que no tenga una evacuacin intestinal normal durante [employer].   Tenga en cuenta:  Es posible que note un poco de irritacin y congestin en la nariz o algn drenaje.  Esto es debido al oxgeno [employer] durante su procedimiento.  No hay que preocuparse y esto debe desaparecer ms o Scientist, research (medical).   SNTOMAS PARA REPORTAR INMEDIATAMENTE:  Despus de una endoscopia inferior (colonoscopia o sigmoidoscopia flexible):  Cantidades excesivas de sangre en las heces fecales  Sensibilidad significativa o empeoramiento de los dolores abdominales   Hinchazn aguda del abdomen que antes no tena   Fiebre de 100F o ms     Para asuntos urgentes o de Freight forwarder, puede comunicarse con un gastroenterlogo a cualquier hora llamando al [phone].  DIETA:  Recomendamos una comida pequea al principio, pero luego puede continuar con su dieta normal.   Tome muchos lquidos, Teacher, adult education las bebidas alcohlicas durante 24 horas.    ACTIVIDAD:  Debe planear tomarse las cosas con calma por el resto del da y no debe CONDUCIR ni usar maquinaria pesada Programmer, applications (debido a los medicamentos de sedacin utilizados durante el examen).     SEGUIMIENTO: Nuestro personal llamar al nmero que aparece en su historial al siguiente da hbil de su procedimiento para ver cmo se siente y para responder cualquier pregunta o inquietud que pueda tener con respecto a la informacin que se le dio despus del procedimiento. Si no podemos contactarle, le dejaremos un mensaje.  Sin embargo, si se siente bien y no tiene Paediatric nurse, no es necesario que nos devuelva la llamada.  Asumiremos que ha regresado a sus actividades diarias normales sin incidentes. Si se le tomaron algunas biopsias, le contactaremos por telfono o por carta en las prximas 3 semanas.  Si no ha sabido [employer] biopsias en el transcurso de 3 semanas, por favor llmenos al [phone].   FIRMAS/CONFIDENCIALIDAD: Usted y/o el acompaante que le cuide han firmado documentos que se ingresarn en su historial mdico Emergency planning/management officer.  Estas firmas atestiguan el hecho de que la informacin anterior  USTED TUVO UN PROCEDIMIENTO ENDOSCPICO HOY EN EL [employer] ENDOSCOPY CENTER:   Lea el informe del procedimiento que se le entreg para cualquier pregunta especfica sobre lo que se Primary school teacher.  Si el informe del examen no responde a sus preguntas, por favor llame a su gastroenterlogo para aclararlo.  Si usted solicit que no se le den [employer] de lo que se Estate manager/land agent en su procedimiento al [employer] va a cuidar, entonces el informe del procedimiento se ha incluido en un sobre sellado para que usted lo revise despus cuando le sea ms conveniente.   LO QUE PUEDE ESPERAR: Algunas sensaciones de hinchazn en el abdomen.  Puede tener ms gases de lo normal.  El caminar puede ayudarle  a eliminar el aire que se le puso en el tracto gastrointestinal durante el procedimiento y reducir la hinchazn.  Si le hicieron una endoscopia inferior (como una colonoscopia o una sigmoidoscopia flexible), podra notar manchas de sangre en las heces fecales o en el papel higinico.  Si se someti a una preparacin intestinal para su procedimiento, es posible que no tenga una evacuacin intestinal normal durante [employer].   Tenga en cuenta:  Es posible que note un poco de irritacin y congestin en la nariz o algn drenaje.  Esto es debido al oxgeno [employer] durante su procedimiento.  No hay que preocuparse y esto debe desaparecer ms o Scientist, research (medical).   SNTOMAS PARA REPORTAR INMEDIATAMENTE:  Despus de una endoscopia inferior (colonoscopia o sigmoidoscopia flexible):  Cantidades excesivas de sangre en las heces fecales  Sensibilidad significativa o empeoramiento de los dolores abdominales   Hinchazn aguda del abdomen que antes no tena   Fiebre de 100F o ms     Para asuntos urgentes o de Freight forwarder, puede comunicarse con un gastroenterlogo a cualquier hora llamando al [phone].  DIETA:  Recomendamos una comida pequea al principio, pero luego puede continuar con su dieta normal.  Tome muchos lquidos, Teacher, adult education las bebidas alcohlicas durante 24 horas.    ACTIVIDAD:  Debe planear tomarse las cosas con calma por el resto del da y no debe CONDUCIR ni usar maquinaria pesada Programmer, applications (debido a los medicamentos de sedacin utilizados durante el examen).     SEGUIMIENTO: Nuestro personal llamar al nmero que aparece en su historial al siguiente da hbil de su procedimiento para ver cmo se siente y para responder cualquier pregunta o inquietud que pueda tener con respecto a la informacin que se le dio despus del procedimiento. Si no podemos contactarle, le dejaremos un mensaje.  Sin embargo, si se siente bien y no tiene Paediatric nurse, no es necesario que nos  devuelva la llamada.  Asumiremos que ha regresado a sus actividades diarias normales sin incidentes. Si se le tomaron algunas biopsias, le contactaremos por telfono o por carta en las prximas 3 semanas.  Si no ha sabido [employer] biopsias en el transcurso de 3 semanas, por favor llmenos al [phone].   FIRMAS/CONFIDENCIALIDAD: Usted y/o el acompaante que le cuide han firmado documentos que se ingresarn en su historial mdico electrnico.  Estas firmas atestiguan el hecho de que la informacin anterior

## 2021-12-10 NOTE — Progress Notes (Signed)
GASTROENTEROLOGY PROCEDURE H&P NOTE   Primary Care Physician: Donald Prose, MD  HPI: Jesus [name] is a 71 y.o. male who presents for colonoscopy for surveillance in setting of previous polyps in 2020.  Past Medical History:  Diagnosis Date   Anemia    in the past   Anxiety    Arthritis    in feet,knees   Depression    Diabetes (HCC)    GERD (gastroesophageal reflux disease)    Glaucoma    History of colon polyps    Hypertension    Low back pain    Past Surgical History:  Procedure Laterality Date   BACK SURGERY     25 years ago   COLONOSCOPY  08/24/2018   Current Outpatient Medications  Medication Sig Dispense Refill   ACCU-CHEK SMARTVIEW test strip See admin instructions.  5   acyclovir (ZOVIRAX) 200 MG capsule TK 1 C PO Q 4 H WHILE AWAKE  5   ARIPiprazole (ABILIFY) 5 MG tablet Take 5 mg by mouth daily.     aspirin 81 MG tablet Take 81 mg by mouth daily.     atenolol (TENORMIN) 25 MG tablet Take 2 tablets (50 mg total) by mouth daily. (Patient taking differently: Take 25 mg by mouth daily.) 60 tablet 0   benzonatate (TESSALON) 100 MG capsule Take 1 capsule (100 mg total) by mouth every 8 (eight) hours. 21 capsule 0   celecoxib (CELEBREX) 200 MG capsule Take by mouth.     Cholecalciferol (VITAMIN D3 PO) Take by mouth.     diclofenac sodium (VOLTAREN) 1 % GEL Apply 2 g topically 4 (four) times daily. 5 Tube 1   esomeprazole (NEXIUM) 40 MG capsule Take 40 mg by mouth daily.     fenofibrate 160 MG tablet Take 160 mg by mouth daily.     ferrous sulfate 325 (65 FE) MG tablet Take 325 mg by mouth daily with breakfast.     gabapentin (NEURONTIN) 100 MG capsule SMARTSIG:2 Capsule(s) By Mouth Every Evening     glimepiride (AMARYL) 2 MG tablet Take 2 mg by mouth daily with breakfast.     ibuprofen (ADVIL) 200 MG tablet Take 200 mg by mouth every 6 (six) hours as needed.     lisinopril (PRINIVIL,ZESTRIL) 5 MG tablet Take 1 tablet (5 mg total) by mouth daily. (Patient  taking differently: Take 5 mg by mouth at bedtime.) 30 tablet 0   loratadine (CLARITIN) 10 MG tablet Take 10 mg by mouth daily.     metFORMIN (GLUCOPHAGE) 500 MG tablet Take 1 tablet (500 mg total) by mouth 2 (two) times daily with a meal. (Patient taking differently: Take 1,000 mg by mouth 2 (two) times daily with a meal. Take 1 gm in lunchtime and half pill at dinner) 60 tablet 0   montelukast (SINGULAIR) 10 MG tablet Take 10 mg by mouth at bedtime.     Multiple Vitamins-Minerals (CENTRUM SILVER PO) Take 1 tablet by mouth daily.     naproxen (NAPROSYN) 500 MG tablet Take 500 mg by mouth 2 (two) times daily with a meal.     Omega 3-6-9 Fatty Acids (OMEGA 3-6-9 COMPLEX PO) Take 1 capsule by mouth daily.     omeprazole (PRILOSEC) 40 MG capsule Take 40 mg by mouth. Take one before dinner     oseltamivir (TAMIFLU) 75 MG capsule Take 1 capsule (75 mg total) by mouth every 12 (twelve) hours. Take all of medication. 10 capsule 0   oxyCODONE (ROXICODONE) 5  MG immediate release tablet Take 0.5 tablets (2.5 mg total) by mouth every 4 (four) hours as needed for severe pain. 5 tablet 0   oxyCODONE-acetaminophen (PERCOCET/ROXICET) 5-325 MG tablet Take 1 tablet by mouth every 4 (four) hours as needed for severe pain. 6 tablet 0   pravastatin (PRAVACHOL) 10 MG tablet Take 10 mg by mouth daily.     ranitidine (ZANTAC) 300 MG capsule Take 300 mg by mouth every evening.     sertraline (ZOLOFT) 50 MG tablet Take 50 mg by mouth at bedtime.     tamsulosin (FLOMAX) 0.4 MG CAPS capsule Take 0.4 mg by mouth daily.     zolpidem (AMBIEN) 5 MG tablet Take 5 mg by mouth at bedtime as needed.     Current Facility-Administered Medications  Medication Dose Route Frequency Provider Last Rate Last Admin   0.9 %  sodium chloride infusion  500 mL Intravenous Once Milus Banister, MD       0.9 %  sodium chloride infusion  500 mL Intravenous Once Mansouraty, Telford Nab., MD        Current Outpatient Medications:    ACCU-CHEK  SMARTVIEW test strip, See admin instructions., Disp: , Rfl: 5   acyclovir (ZOVIRAX) 200 MG capsule, TK 1 C PO Q 4 H WHILE AWAKE, Disp: , Rfl: 5   ARIPiprazole (ABILIFY) 5 MG tablet, Take 5 mg by mouth daily., Disp: , Rfl:    aspirin 81 MG tablet, Take 81 mg by mouth daily., Disp: , Rfl:    atenolol (TENORMIN) 25 MG tablet, Take 2 tablets (50 mg total) by mouth daily. (Patient taking differently: Take 25 mg by mouth daily.), Disp: 60 tablet, Rfl: 0   benzonatate (TESSALON) 100 MG capsule, Take 1 capsule (100 mg total) by mouth every 8 (eight) hours., Disp: 21 capsule, Rfl: 0   celecoxib (CELEBREX) 200 MG capsule, Take by mouth., Disp: , Rfl:    Cholecalciferol (VITAMIN D3 PO), Take by mouth., Disp: , Rfl:    diclofenac sodium (VOLTAREN) 1 % GEL, Apply 2 g topically 4 (four) times daily., Disp: 5 Tube, Rfl: 1   esomeprazole (NEXIUM) 40 MG capsule, Take 40 mg by mouth daily., Disp: , Rfl:    fenofibrate 160 MG tablet, Take 160 mg by mouth daily., Disp: , Rfl:    ferrous sulfate 325 (65 FE) MG tablet, Take 325 mg by mouth daily with breakfast., Disp: , Rfl:    gabapentin (NEURONTIN) 100 MG capsule, SMARTSIG:2 Capsule(s) By Mouth Every Evening, Disp: , Rfl:    glimepiride (AMARYL) 2 MG tablet, Take 2 mg by mouth daily with breakfast., Disp: , Rfl:    ibuprofen (ADVIL) 200 MG tablet, Take 200 mg by mouth every 6 (six) hours as needed., Disp: , Rfl:    lisinopril (PRINIVIL,ZESTRIL) 5 MG tablet, Take 1 tablet (5 mg total) by mouth daily. (Patient taking differently: Take 5 mg by mouth at bedtime.), Disp: 30 tablet, Rfl: 0   loratadine (CLARITIN) 10 MG tablet, Take 10 mg by mouth daily., Disp: , Rfl:    metFORMIN (GLUCOPHAGE) 500 MG tablet, Take 1 tablet (500 mg total) by mouth 2 (two) times daily with a meal. (Patient taking differently: Take 1,000 mg by mouth 2 (two) times daily with a meal. Take 1 gm in lunchtime and half pill at dinner), Disp: 60 tablet, Rfl: 0   montelukast (SINGULAIR) 10 MG tablet,  Take 10 mg by mouth at bedtime., Disp: , Rfl:    Multiple Vitamins-Minerals (CENTRUM SILVER PO),  Take 1 tablet by mouth daily., Disp: , Rfl:    naproxen (NAPROSYN) 500 MG tablet, Take 500 mg by mouth 2 (two) times daily with a meal., Disp: , Rfl:    Omega 3-6-9 Fatty Acids (OMEGA 3-6-9 COMPLEX PO), Take 1 capsule by mouth daily., Disp: , Rfl:    omeprazole (PRILOSEC) 40 MG capsule, Take 40 mg by mouth. Take one before dinner, Disp: , Rfl:    oseltamivir (TAMIFLU) 75 MG capsule, Take 1 capsule (75 mg total) by mouth every 12 (twelve) hours. Take all of medication., Disp: 10 capsule, Rfl: 0   oxyCODONE (ROXICODONE) 5 MG immediate release tablet, Take 0.5 tablets (2.5 mg total) by mouth every 4 (four) hours as needed for severe pain., Disp: 5 tablet, Rfl: 0   oxyCODONE-acetaminophen (PERCOCET/ROXICET) 5-325 MG tablet, Take 1 tablet by mouth every 4 (four) hours as needed for severe pain., Disp: 6 tablet, Rfl: 0   pravastatin (PRAVACHOL) 10 MG tablet, Take 10 mg by mouth daily., Disp: , Rfl:    ranitidine (ZANTAC) 300 MG capsule, Take 300 mg by mouth every evening., Disp: , Rfl:    sertraline (ZOLOFT) 50 MG tablet, Take 50 mg by mouth at bedtime., Disp: , Rfl:    tamsulosin (FLOMAX) 0.4 MG CAPS capsule, Take 0.4 mg by mouth daily., Disp: , Rfl:    zolpidem (AMBIEN) 5 MG tablet, Take 5 mg by mouth at bedtime as needed., Disp: , Rfl:   Current Facility-Administered Medications:    0.9 %  sodium chloride infusion, 500 mL, Intravenous, Once, Milus Banister, MD   0.9 %  sodium chloride infusion, 500 mL, Intravenous, Once, Mansouraty, Telford Nab., MD No Known Allergies Family History  Problem Relation Age of Onset   Anemia Mother    Arthritis Mother    Diabetes Father    Hypertension Father    Diabetes Sister    Ovarian cancer Sister    Diabetes Brother    Other Sister    Colon cancer Neg Hx    Esophageal cancer Neg Hx    Stomach cancer Neg Hx    Rectal cancer Neg Hx    Social History    Socioeconomic History   Marital status: Single    Spouse name: Not on file   Number of children: 4   Years of education: Not on file   Highest education level: Not on file  Occupational History   Not on file  Tobacco Use   Smoking status: Never   Smokeless tobacco: Never  Vaping Use   Vaping Use: Never used  Substance and Sexual Activity   Alcohol use: No   Drug use: No   Sexual activity: Not on file  Other Topics Concern   Not on file  Social History Narrative   Not on file   Social Determinants of Health   Financial Resource Strain: Not on file  Food Insecurity: Not on file  Transportation Needs: Not on file  Physical Activity: Not on file  Stress: Not on file  Social Connections: Not on file  Intimate Partner Violence: Not on file    Physical Exam: Today's Vitals   12/10/21 1345  BP: 124/63  Pulse: 62  Temp: 98.4 F (36.9 C)  TempSrc: Temporal  SpO2: 96%  Weight: 160 lb (72.6 kg)  Height: '5\' 7"'$  (1.702 m)   Body mass index is 25.06 kg/m. GEN: NAD EYE: Sclerae anicteric ENT: MMM CV: Non-tachycardic GI: Soft, NT/ND NEURO:  Alert & Oriented x 3  Lab  Results: No results for input(s): "WBC", "HGB", "HCT", "PLT" in the last 72 hours. BMET No results for input(s): "NA", "K", "CL", "CO2", "GLUCOSE", "BUN", "CREATININE", "CALCIUM" in the last 72 hours. LFT No results for input(s): "PROT", "ALBUMIN", "AST", "ALT", "ALKPHOS", "BILITOT", "BILIDIR", "IBILI" in the last 72 hours. PT/INR No results for input(s): "LABPROT", "INR" in the last 72 hours.   Impression / Plan: This is a 71 y.o.male  who presents for colonoscopy for surveillance in setting of previous polyps in 2020.  The risks and benefits of endoscopic evaluation/treatment were discussed with the patient and/or family; these include but are not limited to the risk of perforation, infection, bleeding, missed lesions, lack of diagnosis, severe illness requiring hospitalization, as well as anesthesia  and sedation related illnesses.  The patient's history has been reviewed, patient examined, no change in status, and deemed stable for procedure.  The patient and/or family is agreeable to proceed.    Justice Britain, MD [facility] Office # [phone]

## 2021-12-10 NOTE — Progress Notes (Signed)
Report to PACU, RN, vss, BBS= Clear.  

## 2021-12-10 NOTE — Progress Notes (Signed)
Pt's states no medical or surgical changes since previsit or office visit. 

## 2021-12-11 ENCOUNTER — Telehealth: Payer: Self-pay | Admitting: *Deleted

## 2021-12-11 NOTE — Telephone Encounter (Signed)
  Follow up Call-     12/10/2021    1:50 PM  Call back number  Post procedure Call Back phone  # [phone]  Permission to leave phone message Yes     Patient questions:  Do you have a fever, pain , or abdominal swelling? No. Pain Score  0 *  Have you tolerated food without any problems? Yes.    Have you been able to return to your normal activities? Yes.    Do you have any questions about your discharge instructions: Diet   No. Medications  No. Follow up visit  No.  Do you have questions or concerns about your Care? Yes.    Actions: * If pain score is 4 or above: No action needed, pain <4.

## 2021-12-31 DIAGNOSIS — G250 Essential tremor: Secondary | ICD-10-CM | POA: Diagnosis not present

## 2021-12-31 DIAGNOSIS — Z0289 Encounter for other administrative examinations: Secondary | ICD-10-CM | POA: Diagnosis not present

## 2021-12-31 DIAGNOSIS — K130 Diseases of lips: Secondary | ICD-10-CM | POA: Diagnosis not present

## 2022-01-01 DIAGNOSIS — N341 Nonspecific urethritis: Secondary | ICD-10-CM | POA: Diagnosis not present

## 2022-01-01 DIAGNOSIS — N390 Urinary tract infection, site not specified: Secondary | ICD-10-CM | POA: Diagnosis not present

## 2022-01-07 ENCOUNTER — Encounter: Payer: Self-pay | Admitting: Gastroenterology

## 2022-01-07 ENCOUNTER — Ambulatory Visit (AMBULATORY_SURGERY_CENTER): Payer: Medicare Other | Admitting: Gastroenterology

## 2022-01-07 VITALS — BP 120/81 | HR 72 | Temp 97.7°F | Resp 13 | Ht 67.0 in | Wt 160.0 lb

## 2022-01-07 DIAGNOSIS — Z86010 Personal history of colonic polyps: Secondary | ICD-10-CM | POA: Diagnosis not present

## 2022-01-07 DIAGNOSIS — D123 Benign neoplasm of transverse colon: Secondary | ICD-10-CM | POA: Diagnosis not present

## 2022-01-07 DIAGNOSIS — F32A Depression, unspecified: Secondary | ICD-10-CM | POA: Diagnosis not present

## 2022-01-07 DIAGNOSIS — D127 Benign neoplasm of rectosigmoid junction: Secondary | ICD-10-CM

## 2022-01-07 DIAGNOSIS — D128 Benign neoplasm of rectum: Secondary | ICD-10-CM

## 2022-01-07 DIAGNOSIS — D120 Benign neoplasm of cecum: Secondary | ICD-10-CM

## 2022-01-07 DIAGNOSIS — Z09 Encounter for follow-up examination after completed treatment for conditions other than malignant neoplasm: Secondary | ICD-10-CM | POA: Diagnosis not present

## 2022-01-07 DIAGNOSIS — D122 Benign neoplasm of ascending colon: Secondary | ICD-10-CM

## 2022-01-07 DIAGNOSIS — I10 Essential (primary) hypertension: Secondary | ICD-10-CM | POA: Diagnosis not present

## 2022-01-07 DIAGNOSIS — E119 Type 2 diabetes mellitus without complications: Secondary | ICD-10-CM | POA: Diagnosis not present

## 2022-01-07 MED ORDER — SODIUM CHLORIDE 0.9 % IV SOLN: 500.0000 mL | Freq: Once | INTRAVENOUS | Status: DC

## 2022-01-07 NOTE — Op Note (Signed)
[place] Patient Name: Jesus [name] Procedure Date: 01/07/2022 2:06 PM MRN: [id number] Endoscopist: Justice Britain , MD, [id number] Age: 71 Referring MD:  Date of Birth: June 17, 1950 Gender: Male Account #: [id number] Procedure:                Colonoscopy Indications:              Surveillance: Personal history of adenomatous                            polyps on last colonoscopy > 3 years ago Medicines:                Monitored Anesthesia Care Procedure:                Pre-Anesthesia Assessment:                           - Prior to the procedure, a History and Physical                            was performed, and patient medications and                            allergies were reviewed. The patient's tolerance of                            previous anesthesia was also reviewed. The risks                            and benefits of the procedure and the sedation                            options and risks were discussed with the patient.                            All questions were answered, and informed consent                            was obtained. Prior Anticoagulants: The patient has                            taken no anticoagulant or antiplatelet agents                            except for aspirin. ASA Grade Assessment: II - A                            patient with mild systemic disease. After reviewing                            the risks and benefits, the patient was deemed in                            satisfactory condition to undergo the procedure.  After obtaining informed consent, the colonoscope                            was passed under direct vision. Throughout the                            procedure, the patient's blood pressure, pulse, and                            oxygen saturations were monitored continuously. The                            Olympus CF-HQ190L (Serial# [id number]) Colonoscope was                             introduced through the anus and advanced to the 5                            cm into the ileum. The colonoscopy was performed                            without difficulty. The patient tolerated the                            procedure. The quality of the bowel preparation was                            inadequate. The terminal ileum, ileocecal valve,                            appendiceal orifice, and rectum were photographed. Scope In: 2:18:33 PM Scope Out: 2:34:40 PM Scope Withdrawal Time: 0 hours 13 minutes 30 seconds  Total Procedure Duration: 0 hours 16 minutes 7 seconds  Findings:                 The digital rectal exam findings include                            hemorrhoids. Pertinent negatives include no                            palpable rectal lesions.                           Extensive amounts of semi-liquid stool was found in                            the entire colon, interfering with visualization.                            Lavage of the area was performed using copious                            amounts, resulting in clearance with adequate  visualization.                           The terminal ileum and ileocecal valve appeared                            normal.                           Eight sessile polyps were found in the rectum (1),                            recto-sigmoid colon (1), transverse colon (3),                            ascending colon (2) and cecum (1). The polyps were                            2 to 10 mm in size. These polyps were removed with                            a cold snare. Resection and retrieval were complete.                           Non-bleeding non-thrombosed external and internal                            hemorrhoids were found during retroflexion, during                            perianal exam and during digital exam. The                            hemorrhoids were Grade II (internal  hemorrhoids                            that prolapse but reduce spontaneously). Complications:            No immediate complications. Estimated Blood Loss:     Estimated blood loss was minimal. Impression:               - Preparation of the colon was inadequate.                           - Hemorrhoids found on digital rectal exam.                           - Stool in the entire examined colon - lavaged with                            inadequate preparation.                           - The examined portion of the ileum was normal.                           -  Eight 2 to 10 mm polyps in the rectum, at the                            recto-sigmoid colon, in the transverse colon, in                            the ascending colon and in the cecum, removed with                            a cold snare. Resected and retrieved.                           - Non-bleeding non-thrombosed external and internal                            hemorrhoids. Recommendation:           - The patient will be observed post-procedure,                            until all discharge criteria are met.                           - Discharge patient to home.                           - Patient has a contact number available for                            emergencies. The signs and symptoms of potential                            delayed complications were discussed with the                            patient. Return to normal activities tomorrow.                            Written discharge instructions were provided to the                            patient.                           - High fiber diet.                           - Use FiberCon 1-2 tablets PO daily.                           - Await pathology results.                           - Repeat colonoscopy within the next 6 to 12 months  because the bowel preparation was poor.                           - Will need to be seen in clinic to  optimize bowel                            habits before next procedure is scheduled since                            patient still having inadequate preparation even                            with a 2-day prep.                           - The findings and recommendations were discussed                            with the patient.                           - The findings and recommendations were discussed                            with the patient's family. Justice Britain, MD 01/07/2022 2:54:28 PM

## 2022-01-07 NOTE — Progress Notes (Unsigned)
GASTROENTEROLOGY PROCEDURE H&P NOTE   Primary Care Physician: Donald Prose, MD  HPI: Jesus [name] is a 71 y.o. male who presents for colonoscopy for surveillance in setting of previous polyps.  Past Medical History:  Diagnosis Date   Anemia    in the past   Anxiety    Arthritis    in feet,knees   Depression    Diabetes (HCC)    GERD (gastroesophageal reflux disease)    Glaucoma    History of colon polyps    Hypertension    Low back pain    Past Surgical History:  Procedure Laterality Date   BACK SURGERY     25 years ago   COLONOSCOPY  08/24/2018   Current Outpatient Medications  Medication Sig Dispense Refill   ACCU-CHEK SMARTVIEW test strip See admin instructions.  5   acyclovir (ZOVIRAX) 200 MG capsule TK 1 C PO Q 4 H WHILE AWAKE  5   ARIPiprazole (ABILIFY) 5 MG tablet Take 5 mg by mouth daily.     aspirin 81 MG tablet Take 81 mg by mouth daily.     atenolol (TENORMIN) 25 MG tablet Take 2 tablets (50 mg total) by mouth daily. (Patient taking differently: Take 25 mg by mouth daily.) 60 tablet 0   benzonatate (TESSALON) 100 MG capsule Take 1 capsule (100 mg total) by mouth every 8 (eight) hours. 21 capsule 0   celecoxib (CELEBREX) 200 MG capsule Take by mouth.     Cholecalciferol (VITAMIN D3 PO) Take by mouth.     diclofenac sodium (VOLTAREN) 1 % GEL Apply 2 g topically 4 (four) times daily. 5 Tube 1   esomeprazole (NEXIUM) 40 MG capsule Take 40 mg by mouth daily.     fenofibrate 160 MG tablet Take 160 mg by mouth daily.     ferrous sulfate 325 (65 FE) MG tablet Take 325 mg by mouth daily with breakfast. (Patient not taking: Reported on 12/10/2021)     gabapentin (NEURONTIN) 100 MG capsule SMARTSIG:2 Capsule(s) By Mouth Every Evening (Patient not taking: Reported on 12/10/2021)     glimepiride (AMARYL) 2 MG tablet Take 2 mg by mouth daily with breakfast.     ibuprofen (ADVIL) 200 MG tablet Take 200 mg by mouth every 6 (six) hours as needed.     lisinopril  (PRINIVIL,ZESTRIL) 5 MG tablet Take 1 tablet (5 mg total) by mouth daily. (Patient not taking: Reported on 12/10/2021) 30 tablet 0   loratadine (CLARITIN) 10 MG tablet Take 10 mg by mouth daily.     metFORMIN (GLUCOPHAGE) 500 MG tablet Take 1 tablet (500 mg total) by mouth 2 (two) times daily with a meal. (Patient taking differently: Take 1,000 mg by mouth 2 (two) times daily with a meal. Take 1 gm in lunchtime and half pill at dinner) 60 tablet 0   montelukast (SINGULAIR) 10 MG tablet Take 10 mg by mouth at bedtime.     Multiple Vitamins-Minerals (CENTRUM SILVER PO) Take 1 tablet by mouth daily.     naproxen (NAPROSYN) 500 MG tablet Take 500 mg by mouth 2 (two) times daily with a meal.     Omega 3-6-9 Fatty Acids (OMEGA 3-6-9 COMPLEX PO) Take 1 capsule by mouth daily.     omeprazole (PRILOSEC) 40 MG capsule Take 40 mg by mouth. Take one before dinner     oseltamivir (TAMIFLU) 75 MG capsule Take 1 capsule (75 mg total) by mouth every 12 (twelve) hours. Take all of medication. 10 capsule  0   oxyCODONE (ROXICODONE) 5 MG immediate release tablet Take 0.5 tablets (2.5 mg total) by mouth every 4 (four) hours as needed for severe pain. 5 tablet 0   oxyCODONE-acetaminophen (PERCOCET/ROXICET) 5-325 MG tablet Take 1 tablet by mouth every 4 (four) hours as needed for severe pain. 6 tablet 0   pravastatin (PRAVACHOL) 10 MG tablet Take 10 mg by mouth daily. (Patient not taking: Reported on 12/10/2021)     ranitidine (ZANTAC) 300 MG capsule Take 300 mg by mouth every evening.     sertraline (ZOLOFT) 50 MG tablet Take 50 mg by mouth at bedtime.     tamsulosin (FLOMAX) 0.4 MG CAPS capsule Take 0.4 mg by mouth daily.     zolpidem (AMBIEN) 5 MG tablet Take 5 mg by mouth at bedtime as needed.     Current Facility-Administered Medications  Medication Dose Route Frequency Provider Last Rate Last Admin   0.9 %  sodium chloride infusion  500 mL Intravenous Once Milus Banister, MD       0.9 %  sodium chloride infusion   500 mL Intravenous Once Mansouraty, Telford Nab., MD       0.9 %  sodium chloride infusion  500 mL Intravenous Once Mansouraty, Telford Nab., MD        Current Outpatient Medications:    ACCU-CHEK SMARTVIEW test strip, See admin instructions., Disp: , Rfl: 5   acyclovir (ZOVIRAX) 200 MG capsule, TK 1 C PO Q 4 H WHILE AWAKE, Disp: , Rfl: 5   ARIPiprazole (ABILIFY) 5 MG tablet, Take 5 mg by mouth daily., Disp: , Rfl:    aspirin 81 MG tablet, Take 81 mg by mouth daily., Disp: , Rfl:    atenolol (TENORMIN) 25 MG tablet, Take 2 tablets (50 mg total) by mouth daily. (Patient taking differently: Take 25 mg by mouth daily.), Disp: 60 tablet, Rfl: 0   benzonatate (TESSALON) 100 MG capsule, Take 1 capsule (100 mg total) by mouth every 8 (eight) hours., Disp: 21 capsule, Rfl: 0   celecoxib (CELEBREX) 200 MG capsule, Take by mouth., Disp: , Rfl:    Cholecalciferol (VITAMIN D3 PO), Take by mouth., Disp: , Rfl:    diclofenac sodium (VOLTAREN) 1 % GEL, Apply 2 g topically 4 (four) times daily., Disp: 5 Tube, Rfl: 1   esomeprazole (NEXIUM) 40 MG capsule, Take 40 mg by mouth daily., Disp: , Rfl:    fenofibrate 160 MG tablet, Take 160 mg by mouth daily., Disp: , Rfl:    ferrous sulfate 325 (65 FE) MG tablet, Take 325 mg by mouth daily with breakfast. (Patient not taking: Reported on 12/10/2021), Disp: , Rfl:    gabapentin (NEURONTIN) 100 MG capsule, SMARTSIG:2 Capsule(s) By Mouth Every Evening (Patient not taking: Reported on 12/10/2021), Disp: , Rfl:    glimepiride (AMARYL) 2 MG tablet, Take 2 mg by mouth daily with breakfast., Disp: , Rfl:    ibuprofen (ADVIL) 200 MG tablet, Take 200 mg by mouth every 6 (six) hours as needed., Disp: , Rfl:    lisinopril (PRINIVIL,ZESTRIL) 5 MG tablet, Take 1 tablet (5 mg total) by mouth daily. (Patient not taking: Reported on 12/10/2021), Disp: 30 tablet, Rfl: 0   loratadine (CLARITIN) 10 MG tablet, Take 10 mg by mouth daily., Disp: , Rfl:    metFORMIN (GLUCOPHAGE) 500 MG tablet,  Take 1 tablet (500 mg total) by mouth 2 (two) times daily with a meal. (Patient taking differently: Take 1,000 mg by mouth 2 (two) times daily with a  meal. Take 1 gm in lunchtime and half pill at dinner), Disp: 60 tablet, Rfl: 0   montelukast (SINGULAIR) 10 MG tablet, Take 10 mg by mouth at bedtime., Disp: , Rfl:    Multiple Vitamins-Minerals (CENTRUM SILVER PO), Take 1 tablet by mouth daily., Disp: , Rfl:    naproxen (NAPROSYN) 500 MG tablet, Take 500 mg by mouth 2 (two) times daily with a meal., Disp: , Rfl:    Omega 3-6-9 Fatty Acids (OMEGA 3-6-9 COMPLEX PO), Take 1 capsule by mouth daily., Disp: , Rfl:    omeprazole (PRILOSEC) 40 MG capsule, Take 40 mg by mouth. Take one before dinner, Disp: , Rfl:    oseltamivir (TAMIFLU) 75 MG capsule, Take 1 capsule (75 mg total) by mouth every 12 (twelve) hours. Take all of medication., Disp: 10 capsule, Rfl: 0   oxyCODONE (ROXICODONE) 5 MG immediate release tablet, Take 0.5 tablets (2.5 mg total) by mouth every 4 (four) hours as needed for severe pain., Disp: 5 tablet, Rfl: 0   oxyCODONE-acetaminophen (PERCOCET/ROXICET) 5-325 MG tablet, Take 1 tablet by mouth every 4 (four) hours as needed for severe pain., Disp: 6 tablet, Rfl: 0   pravastatin (PRAVACHOL) 10 MG tablet, Take 10 mg by mouth daily. (Patient not taking: Reported on 12/10/2021), Disp: , Rfl:    ranitidine (ZANTAC) 300 MG capsule, Take 300 mg by mouth every evening., Disp: , Rfl:    sertraline (ZOLOFT) 50 MG tablet, Take 50 mg by mouth at bedtime., Disp: , Rfl:    tamsulosin (FLOMAX) 0.4 MG CAPS capsule, Take 0.4 mg by mouth daily., Disp: , Rfl:    zolpidem (AMBIEN) 5 MG tablet, Take 5 mg by mouth at bedtime as needed., Disp: , Rfl:   Current Facility-Administered Medications:    0.9 %  sodium chloride infusion, 500 mL, Intravenous, Once, Milus Banister, MD   0.9 %  sodium chloride infusion, 500 mL, Intravenous, Once, Mansouraty, Telford Nab., MD   0.9 %  sodium chloride infusion, 500 mL,  Intravenous, Once, Mansouraty, Telford Nab., MD No Known Allergies Family History  Problem Relation Age of Onset   Anemia Mother    Arthritis Mother    Diabetes Father    Hypertension Father    Diabetes Sister    Ovarian cancer Sister    Diabetes Brother    Other Sister    Colon cancer Neg Hx    Esophageal cancer Neg Hx    Stomach cancer Neg Hx    Rectal cancer Neg Hx    Social History   Socioeconomic History   Marital status: Single    Spouse name: Not on file   Number of children: 4   Years of education: Not on file   Highest education level: Not on file  Occupational History   Not on file  Tobacco Use   Smoking status: Never   Smokeless tobacco: Never  Vaping Use   Vaping Use: Never used  Substance and Sexual Activity   Alcohol use: No   Drug use: No   Sexual activity: Not on file  Other Topics Concern   Not on file  Social History Narrative   Not on file   Social Determinants of Health   Financial Resource Strain: Not on file  Food Insecurity: Not on file  Transportation Needs: Not on file  Physical Activity: Not on file  Stress: Not on file  Social Connections: Not on file  Intimate Partner Violence: Not on file    Physical Exam: Today's Vitals  01/07/22 1355 01/07/22 1358  BP: 120/73   Pulse: 70   Temp: 97.7 F (36.5 C) 97.7 F (36.5 C)  SpO2: 98%   Weight: 160 lb (72.6 kg)   Height: '5\' 7"'$  (1.702 m)    Body mass index is 25.06 kg/m. GEN: NAD EYE: Sclerae anicteric ENT: MMM CV: Non-tachycardic GI: Soft, NT/ND NEURO:  Alert & Oriented x 3  Lab Results: No results for input(s): "WBC", "HGB", "HCT", "PLT" in the last 72 hours. BMET No results for input(s): "NA", "K", "CL", "CO2", "GLUCOSE", "BUN", "CREATININE", "CALCIUM" in the last 72 hours. LFT No results for input(s): "PROT", "ALBUMIN", "AST", "ALT", "ALKPHOS", "BILITOT", "BILIDIR", "IBILI" in the last 72 hours. PT/INR No results for input(s): "LABPROT", "INR" in the last 72  hours.   Impression / Plan: This is a 71 y.o.male who presents for colonoscopy for surveillance in setting of previous polyps.  The risks and benefits of endoscopic evaluation/treatment were discussed with the patient and/or family; these include but are not limited to the risk of perforation, infection, bleeding, missed lesions, lack of diagnosis, severe illness requiring hospitalization, as well as anesthesia and sedation related illnesses.  The patient's history has been reviewed, patient examined, no change in status, and deemed stable for procedure.  The patient and/or family is agreeable to proceed.    Justice Britain, MD [facility] Office # [phone]

## 2022-01-07 NOTE — Progress Notes (Signed)
Called to room to assist during endoscopic procedure.  Patient ID and intended procedure confirmed with present staff. Received instructions for my participation in the procedure from the performing physician.  

## 2022-01-07 NOTE — Patient Instructions (Signed)
Handout on polyps and high fiber diet given. Use Fiber-con 1-2 tablets daily.    USTED TUVO UN PROCEDIMIENTO ENDOSCPICO HOY EN EL [facility]:   Lea el informe del procedimiento que se le entreg para cualquier pregunta especfica sobre lo que se Primary school teacher.  Si el informe del examen no responde a sus preguntas, por favor llame a su gastroenterlogo para aclararlo.  Si usted solicit que no se le den [employer] de lo que se Estate manager/land agent en su procedimiento al [employer] va a cuidar, entonces el informe del procedimiento se ha incluido en un sobre sellado para que usted lo revise despus cuando le sea ms conveniente.   LO QUE PUEDE ESPERAR: Algunas sensaciones de hinchazn en el abdomen.  Puede tener ms gases de lo normal.  El caminar puede ayudarle a eliminar el aire que se le puso en el tracto gastrointestinal durante el procedimiento y reducir la hinchazn.  Si le hicieron una endoscopia inferior (como una colonoscopia o una sigmoidoscopia flexible), podra notar manchas de sangre en las heces fecales o en el papel higinico.  Si se someti a una preparacin intestinal para su procedimiento, es posible que no tenga una evacuacin intestinal normal durante [employer].   Tenga en cuenta:  Es posible que note un poco de irritacin y congestin en la nariz o algn drenaje.  Esto es debido al oxgeno [employer] durante su procedimiento.  No hay que preocuparse y esto debe desaparecer ms o Scientist, research (medical).   SNTOMAS PARA REPORTAR INMEDIATAMENTE:  Despus de una endoscopia inferior (colonoscopia o sigmoidoscopia flexible):  Cantidades excesivas de sangre en las heces fecales  Sensibilidad significativa o empeoramiento de los dolores abdominales   Hinchazn aguda del abdomen que antes no tena   Fiebre de 100F o ms     Para asuntos urgentes o de Freight forwarder, puede comunicarse con un gastroenterlogo a cualquier hora llamando al [phone].  DIETA:  Recomendamos  una comida pequea al principio, pero luego puede continuar con su dieta normal.  Tome muchos lquidos, Teacher, adult education las bebidas alcohlicas durante 24 horas.    ACTIVIDAD:  Debe planear tomarse las cosas con calma por el resto del da y no debe CONDUCIR ni usar maquinaria pesada Programmer, applications (debido a los medicamentos de sedacin utilizados durante el examen).     SEGUIMIENTO: Nuestro personal llamar al nmero que aparece en su historial al siguiente da hbil de su procedimiento para ver cmo se siente y para responder cualquier pregunta o inquietud que pueda tener con respecto a la informacin que se le dio despus del procedimiento. Si no podemos contactarle, le dejaremos un mensaje.  Sin embargo, si se siente bien y no tiene Paediatric nurse, no es necesario que nos devuelva la llamada.  Asumiremos que ha regresado a sus actividades diarias normales sin incidentes. Si se le tomaron algunas biopsias, le contactaremos por telfono o por carta en las prximas 3 semanas.  Si no ha sabido [employer] biopsias en el transcurso de 3 semanas, por favor llmenos al [phone].   FIRMAS/CONFIDENCIALIDAD: Usted y/o el acompaante que le cuide han firmado documentos que se ingresarn en su historial mdico electrnico.  Estas firmas atestiguan el hecho de que la informacin anterior

## 2022-01-07 NOTE — Progress Notes (Signed)
Interpreter, Hassie Bruce, present during recovery.

## 2022-01-07 NOTE — Progress Notes (Unsigned)
Report to PACU, RN, vss, BBS= Clear.  

## 2022-01-08 ENCOUNTER — Telehealth: Payer: Self-pay

## 2022-01-08 NOTE — Telephone Encounter (Signed)
  Follow up Call-     01/07/2022    1:58 PM 12/10/2021    1:50 PM  Call back number  Post procedure Call Back phone  # [phone]  Permission to leave phone message Yes Yes     Patient questions:  Do you have a fever, pain , or abdominal swelling? No. Pain Score  0 *  Have you tolerated food without any problems? Yes.    Have you been able to return to your normal activities? Yes  Do you have any questions about your discharge instructions: Diet   No. Medications  No. Follow up visit  No.  Do you have questions or concerns about your Care? No.  Actions: * If pain score is 4 or above: No action needed, pain <4.

## 2022-01-13 ENCOUNTER — Encounter: Payer: Self-pay | Admitting: Gastroenterology

## 2022-01-15 DIAGNOSIS — R339 Retention of urine, unspecified: Secondary | ICD-10-CM | POA: Diagnosis not present

## 2022-01-15 DIAGNOSIS — N390 Urinary tract infection, site not specified: Secondary | ICD-10-CM | POA: Diagnosis not present

## 2022-02-06 DIAGNOSIS — N390 Urinary tract infection, site not specified: Secondary | ICD-10-CM | POA: Diagnosis not present

## 2022-02-09 DIAGNOSIS — E113291 Type 2 diabetes mellitus with mild nonproliferative diabetic retinopathy without macular edema, right eye: Secondary | ICD-10-CM | POA: Diagnosis not present

## 2022-02-11 DIAGNOSIS — Z599 Problem related to housing and economic circumstances, unspecified: Secondary | ICD-10-CM | POA: Diagnosis not present

## 2022-02-11 DIAGNOSIS — Z0289 Encounter for other administrative examinations: Secondary | ICD-10-CM | POA: Diagnosis not present

## 2022-02-11 DIAGNOSIS — D696 Thrombocytopenia, unspecified: Secondary | ICD-10-CM | POA: Diagnosis not present

## 2022-02-11 DIAGNOSIS — S91131A Puncture wound without foreign body of right great toe without damage to nail, initial encounter: Secondary | ICD-10-CM | POA: Diagnosis not present

## 2022-02-11 DIAGNOSIS — Z5948 Other specified lack of adequate food: Secondary | ICD-10-CM | POA: Diagnosis not present

## 2022-03-18 DIAGNOSIS — M2550 Pain in unspecified joint: Secondary | ICD-10-CM | POA: Diagnosis not present

## 2022-03-18 DIAGNOSIS — Z5948 Other specified lack of adequate food: Secondary | ICD-10-CM | POA: Diagnosis not present

## 2022-03-18 DIAGNOSIS — Z599 Problem related to housing and economic circumstances, unspecified: Secondary | ICD-10-CM | POA: Diagnosis not present

## 2022-03-18 DIAGNOSIS — Z0289 Encounter for other administrative examinations: Secondary | ICD-10-CM | POA: Diagnosis not present

## 2022-03-18 DIAGNOSIS — I700 Atherosclerosis of aorta: Secondary | ICD-10-CM | POA: Diagnosis not present

## 2022-03-18 DIAGNOSIS — M549 Dorsalgia, unspecified: Secondary | ICD-10-CM | POA: Diagnosis not present

## 2022-03-18 DIAGNOSIS — E1142 Type 2 diabetes mellitus with diabetic polyneuropathy: Secondary | ICD-10-CM | POA: Diagnosis not present

## 2022-03-18 DIAGNOSIS — I10 Essential (primary) hypertension: Secondary | ICD-10-CM | POA: Diagnosis not present

## 2022-04-20 DIAGNOSIS — R21 Rash and other nonspecific skin eruption: Secondary | ICD-10-CM | POA: Diagnosis not present

## 2022-04-20 DIAGNOSIS — Z0289 Encounter for other administrative examinations: Secondary | ICD-10-CM | POA: Diagnosis not present

## 2022-04-20 DIAGNOSIS — E1142 Type 2 diabetes mellitus with diabetic polyneuropathy: Secondary | ICD-10-CM | POA: Diagnosis not present

## 2022-04-20 DIAGNOSIS — I10 Essential (primary) hypertension: Secondary | ICD-10-CM | POA: Diagnosis not present

## 2022-07-23 ENCOUNTER — Encounter: Payer: Self-pay | Admitting: Gastroenterology

## 2022-11-19 ENCOUNTER — Other Ambulatory Visit: Payer: Self-pay | Admitting: Family Medicine

## 2022-11-19 DIAGNOSIS — M545 Low back pain, unspecified: Secondary | ICD-10-CM

## 2023-01-13 ENCOUNTER — Ambulatory Visit
Admission: RE | Admit: 2023-01-13 | Discharge: 2023-01-13 | Disposition: A | Discharge status: Home / Self Care | Payer: 59 | Source: Ambulatory Visit | Attending: Family Medicine | Admitting: Family Medicine

## 2023-01-13 DIAGNOSIS — M545 Low back pain, unspecified: Secondary | ICD-10-CM

## 2023-04-29 ENCOUNTER — Telehealth: Payer: Self-pay | Admitting: *Deleted

## 2023-04-29 NOTE — Telephone Encounter (Signed)
Dr Meridee Score-  How would you like to proceed with this patient?  He had poor prep with you twice and you had noted he needed to see you to optimize bowel habits prior to another colonoscopy, now he is on schedule for recall.  Please advise and share along with your CMA.  Thank you!

## 2023-04-29 NOTE — Telephone Encounter (Signed)
Set up in clinic with APP or myself next available to get him optimized. Put Colonoscopy Recall out 3-4 months. Thanks. GM

## 2023-04-30 ENCOUNTER — Telehealth: Payer: Self-pay | Admitting: *Deleted

## 2023-04-30 NOTE — Telephone Encounter (Signed)
Jesus [name]- Could you please call this patient and relay this info to the patient and get him scheduled in the office?    Thank you !

## 2023-05-12 NOTE — Telephone Encounter (Signed)
 Hi Jenne Campus, Please see Emily's message below.  This patient is scheduled for a PV on 05/17/23 and a colonscopy on 06/01/23, but per Dr. Elesa Hacker instructions, he wanted him seen in the office 1st and push his colon out for 3-4 months. Thanks!

## 2023-05-13 NOTE — Telephone Encounter (Signed)
 Thank you Patty.... Yes, it looks like Irving Burton only sent the message to [place] and I resent it to her yesterday.  I appreciate your help!

## 2023-05-13 NOTE — Telephone Encounter (Signed)
 The message was not sent to Rovonda or I directly. I will take care of it today.  Appts for previsit and procedure have been cancelled and appt made for 07/09/23 at 230 pm.  Pt advised      Mansouraty, Netty Starring., MD to Danielle Rankin, RN  Mansouraty, Netty Starring., MD     04/29/23 10:55 AM Note Set up in clinic with APP or myself next available to get him optimized. Put Colonoscopy Recall out 3-4 months. Thanks. GM

## 2023-05-13 NOTE — Telephone Encounter (Signed)
 Hi Patty and Rovonda,  Please see Dr. Elesa Hacker message from 2/20 and the message trail below.  I was prepping pre-visit charts for Monday and noticed he was still scheduled for the PV and colon, but no office visit had been scheduled.  Thank you, Dwanda Tufano

## 2023-06-01 ENCOUNTER — Encounter: Payer: 59 | Admitting: Gastroenterology

## 2023-07-09 ENCOUNTER — Ambulatory Visit: Admitting: Gastroenterology

## 2023-07-09 ENCOUNTER — Encounter: Payer: Self-pay | Admitting: Gastroenterology

## 2023-07-09 VITALS — BP 130/80 | HR 68 | Ht 67.0 in | Wt 158.1 lb

## 2023-07-09 DIAGNOSIS — Z860101 Personal history of adenomatous and serrated colon polyps: Secondary | ICD-10-CM | POA: Diagnosis not present

## 2023-07-09 DIAGNOSIS — K5900 Constipation, unspecified: Secondary | ICD-10-CM | POA: Diagnosis not present

## 2023-07-09 DIAGNOSIS — Z860100 Personal history of colon polyps, unspecified: Secondary | ICD-10-CM

## 2023-07-09 MED ORDER — PEG 3350-KCL-NA BICARB-NACL 420 G PO SOLR: 4000.0000 mL | Freq: Once | ORAL | 0 refills | Status: AC

## 2023-07-09 NOTE — Progress Notes (Unsigned)
 GASTROENTEROLOGY OUTPATIENT CLINIC VISIT   Primary Care Provider Sun, Vyvyan, MD [address] [place] [address] [phone]  Referring Provider Arvid Latino, MD [phone] [address] [place],  [place] [address] [phone]  Patient Profile: Jesus [name] is a 73 y.o. 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  History of Present Illness    The patient does/does not take NSAIDs or BC/Goody Powder. Patient has/has not had an EGD. Patient has/has not had a Colonoscopy.  GI Review of Systems Positive as above Negative for  Pyrosis; Reflux; Regurgitation; Dysphagia; Odynophagia; Globus; Post-prandial cough; Nocturnal cough; Nasal regurgitation; Epigastric pain; Nausea; Vomiting; Hematemesis; Jaundice; Change in Appetite; Early satiety; Abdominal pain; Abdominal bloating; Eructation; Flatulence; Change in BM Frequency; Change in BM Consistency; Constipation; Diarrhea; Incontinence; Urgency; Tenesmus; Hematochezia; Melena  Review of Systems General: Denies fevers/chills/weight loss unintentionally Cardiovascular: Denies chest pain Pulmonary: Denies shortness of breath Gastroenterological: See HPI Genitourinary: Denies darkened urine Hematological: Denies easy  bruising/bleeding Endocrine: Denies temperature intolerance Dermatological: Denies jaundice Psychological: Mood is stable  Medications Current Outpatient Medications  Medication Sig Dispense Refill   ACCU-CHEK SMARTVIEW test strip See admin instructions.  5   acyclovir (ZOVIRAX) 200 MG capsule TK 1 C PO Q 4 H WHILE AWAKE  5   ARIPiprazole (ABILIFY) 5 MG tablet Take 5 mg by mouth daily.     aspirin 81 MG tablet Take 81 mg by mouth daily.     atenolol  (TENORMIN ) 25 MG tablet Take 2 tablets (50 mg total) by mouth daily. (Patient taking differently: Take 25 mg by mouth daily.) 60 tablet 0   benzonatate  (TESSALON ) 100 MG capsule Take 1 capsule (100 mg total) by mouth every 8 (eight) hours. 21 capsule 0   celecoxib (CELEBREX) 200 MG capsule Take by mouth.     Cholecalciferol (VITAMIN D3 PO) Take by mouth.     diclofenac  sodium (VOLTAREN ) 1 % GEL Apply 2 g topically 4 (four) times daily. 5 Tube 1   esomeprazole (NEXIUM) 40 MG capsule Take 40 mg by mouth daily.     fenofibrate 160 MG tablet Take 160 mg by mouth daily.     ferrous sulfate 325 (65 FE) MG tablet Take 325 mg by mouth daily with breakfast. (Patient not taking: Reported on 12/10/2021)     gabapentin (NEURONTIN) 100 MG capsule SMARTSIG:2 Capsule(s) By Mouth Every Evening (Patient not taking: Reported on 12/10/2021)     glimepiride (AMARYL) 2 MG tablet Take 2 mg by mouth daily with breakfast. (Patient not taking: Reported on 01/07/2022)     ibuprofen (  ADVIL) 200 MG tablet Take 200 mg by mouth every 6 (six) hours as needed.     lisinopril  (PRINIVIL ,ZESTRIL ) 5 MG tablet Take 1 tablet (5 mg total) by mouth daily. (Patient not taking: Reported on 12/10/2021) 30 tablet 0   loratadine (CLARITIN) 10 MG tablet Take 10 mg by mouth daily. (Patient not taking: Reported on 01/07/2022)     metFORMIN  (GLUCOPHAGE ) 500 MG tablet Take 1 tablet (500 mg total) by mouth 2 (two) times daily with a meal. (Patient taking differently: Take 1,000 mg by mouth 2 (two)  times daily with a meal. Take 1 gm in lunchtime and half pill at dinner) 60 tablet 0   montelukast (SINGULAIR) 10 MG tablet Take 10 mg by mouth at bedtime. (Patient not taking: Reported on 01/07/2022)     Multiple Vitamins-Minerals (CENTRUM SILVER PO) Take 1 tablet by mouth daily. (Patient not taking: Reported on 01/07/2022)     naproxen (NAPROSYN) 500 MG tablet Take 500 mg by mouth 2 (two) times daily with a meal. (Patient not taking: Reported on 01/07/2022)     Omega 3-6-9 Fatty Acids (OMEGA 3-6-9 COMPLEX PO) Take 1 capsule by mouth daily.     omeprazole (PRILOSEC) 40 MG capsule Take 40 mg by mouth. Take one before dinner     oseltamivir  (TAMIFLU ) 75 MG capsule Take 1 capsule (75 mg total) by mouth every 12 (twelve) hours. Take all of medication. (Patient not taking: Reported on 01/07/2022) 10 capsule 0   oxyCODONE  (ROXICODONE ) 5 MG immediate release tablet Take 0.5 tablets (2.5 mg total) by mouth every 4 (four) hours as needed for severe pain. 5 tablet 0   oxyCODONE -acetaminophen  (PERCOCET/ROXICET) 5-325 MG tablet Take 1 tablet by mouth every 4 (four) hours as needed for severe pain. 6 tablet 0   pravastatin (PRAVACHOL) 10 MG tablet Take 10 mg by mouth daily.     ranitidine (ZANTAC) 300 MG capsule Take 300 mg by mouth every evening.     sertraline (ZOLOFT) 50 MG tablet Take 50 mg by mouth at bedtime.     tamsulosin (FLOMAX) 0.4 MG CAPS capsule Take 0.4 mg by mouth daily.     zolpidem (AMBIEN) 5 MG tablet Take 5 mg by mouth at bedtime as needed.     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,knees   Depression     Diabetes (HCC)    GERD (gastroesophageal reflux disease)    Glaucoma    History of colon polyps    Hypertension    Low back pain    Past Surgical History:  Procedure Laterality Date   BACK SURGERY     25 years ago   COLONOSCOPY  08/24/2018   Social History   Socioeconomic History   Marital status: Single    Spouse name: Not on file   Number of children: 4   Years of education: Not on file   Highest education level: Not on file  Occupational History   Not on file  Tobacco Use   Smoking status:  Never   Smokeless tobacco: Never  Vaping Use   Vaping status: Never Used  Substance and Sexual Activity   Alcohol use: No   Drug use: No   Sexual activity: Not on file  Other Topics Concern   Not on file  Social History Narrative   Not on file   Social Drivers of Health   Financial Resource Strain: Not on file  Food Insecurity: Not on file  Transportation Needs: Not on file  Physical Activity: Not on file  Stress: Not on file  Social Connections: Not on file  Intimate Partner Violence: Not on file   Family History  Problem Relation Age of Onset   Anemia Mother    Arthritis Mother    Diabetes Father    Hypertension Father    Diabetes Sister    Ovarian cancer Sister    Diabetes Brother    Other Sister    Colon cancer Neg Hx    Esophageal cancer Neg Hx    Stomach cancer Neg Hx    Rectal cancer Neg Hx    I have reviewed his medical, social, and family history in detail and updated the electronic medical record as necessary.    PHYSICAL EXAMINATION  There were no vitals taken for this visit. Wt Readings from Last 3 Encounters:  01/07/22 160 lb (72.6 kg)  12/10/21 160 lb (72.6 kg)  11/13/21 160 lb (72.6 kg)   GEN: NAD, appears stated age, doesn't appear chronically ill PSYCH: Cooperative, without pressured speech EYE: Conjunctivae pink, sclerae anicteric ENT: MMM CV: Nontachycardic RESP: No audible wheezing GI: NABS, soft, NT/ND, without rebound or  guarding, no HSM appreciated GU: DRE shows MSK/EXT: No significant lower extremity edema SKIN: No jaundice, no spider angiomata NEURO:  Alert & Oriented x 3, no focal deficits, no evidence of asterixis   REVIEW OF DATA  I reviewed the following data at the time of this encounter:  GI Procedures and Studies  11/23 Colonoscopy - Stool in the entire examined colon - lavaged with inadequate preparation. - The examined portion of the ileum was normal. - Eight 2 to 10 mm polyps in the rectum, at the recto-sigmoid colon, in the transverse colon, in the ascending colon and in the cecum, removed with a cold snare. Resected and retrieved. - Non-bleeding non-thrombosed external and internal hemorrhoids.  - Stool in the entire examined colon - lavaged with inadequate preparation. - The examined portion of the ileum was normal. - Eight 2 to 10 mm polyps in the rectum, at the recto-sigmoid colon, in the transverse colon, in the ascending colon and in the cecum, removed with a cold snare. Resected and retrieved. - Non-bleeding non-thrombosed external and internal hemorrhoids.  Pathology Diagnosis Surgical [P], colon, cecum, transverse, ascending, recto-sigmoid, rectal, polyp (8) - TUBULAR ADENOMA(S) (MULTIPLE FRAGMENTS). - NEGATIVE FOR HIGH-GRADE DYSPLASIA OR MALIGNANCY.  10/23 Colonoscopy - Preparation of the colon was poor. - Stool in the entire examined colon. - Non-bleeding non-thrombosed internal hemorrhoids.  Laboratory Studies  Reviewed those in epic  Imaging Studies  No relevant studies to review   ASSESSMENT  Jesus [name] is a 73 y.o. male with a pmh significant for The patient is seen today for evaluation and management of:  No diagnosis found.  ***   PLAN  There are no diagnoses linked to this encounter.   No orders of the defined types were placed in this encounter.   New Prescriptions   No medications on file   Modified Medications  No medications on file     Planned Follow Up No follow-ups on file.   Total Time in Face-to-Face and in Coordination of Care for patient including independent/personal interpretation/review of prior testing, medical history, examination, medication adjustment, communicating results with the patient directly, and documentation within the EHR is ***.   Yong Henle, MD [facility] Office # [phone]

## 2023-07-09 NOTE — Patient Instructions (Addendum)
 Hemos enviado los siguientes medicamentos a su farmacia para que los recoja cuando le resulte conveniente: Golytely  Compre los siguientes medicamentos sin receta y tmelos segn las indicaciones: Miralax: disuelva 1 tapn al da en al menos 237 ml (8 onzas) de agua, comenzando 2 semanas antes de su colonoscopia.  You have been scheduled for a colonoscopy. Please follow written instructions given to you at your visit today.   If you use inhalers (even only as needed), please bring them with you on the day of your procedure.  DO NOT TAKE 7 DAYS PRIOR TO TEST- Trulicity (dulaglutide) Ozempic, Wegovy (semaglutide) Mounjaro (tirzepatide) Bydureon Bcise (exanatide extended release)  DO NOT TAKE 1 DAY PRIOR TO YOUR TEST Rybelsus (semaglutide) Adlyxin (lixisenatide) Victoza (liraglutide) Byetta (exanatide)  Thank you for choosing me and [facility].  Dr. Brice Campi

## 2023-07-10 ENCOUNTER — Encounter: Payer: Self-pay | Admitting: Gastroenterology

## 2023-07-10 DIAGNOSIS — Z860101 Personal history of adenomatous and serrated colon polyps: Secondary | ICD-10-CM | POA: Insufficient documentation

## 2023-07-10 DIAGNOSIS — K5900 Constipation, unspecified: Secondary | ICD-10-CM | POA: Insufficient documentation

## 2023-07-27 ENCOUNTER — Encounter: Payer: Self-pay | Admitting: Gastroenterology

## 2023-08-10 ENCOUNTER — Ambulatory Visit: Admitting: Gastroenterology

## 2023-08-10 ENCOUNTER — Encounter: Payer: Self-pay | Admitting: Gastroenterology

## 2023-08-10 VITALS — BP 127/79 | HR 73 | Temp 97.9°F | Resp 10 | Ht 67.0 in | Wt 158.2 lb

## 2023-08-10 DIAGNOSIS — Z1211 Encounter for screening for malignant neoplasm of colon: Secondary | ICD-10-CM

## 2023-08-10 DIAGNOSIS — D122 Benign neoplasm of ascending colon: Secondary | ICD-10-CM | POA: Diagnosis not present

## 2023-08-10 DIAGNOSIS — Z860101 Personal history of adenomatous and serrated colon polyps: Secondary | ICD-10-CM

## 2023-08-10 DIAGNOSIS — K641 Second degree hemorrhoids: Secondary | ICD-10-CM

## 2023-08-10 DIAGNOSIS — K644 Residual hemorrhoidal skin tags: Secondary | ICD-10-CM

## 2023-08-10 DIAGNOSIS — K5900 Constipation, unspecified: Secondary | ICD-10-CM

## 2023-08-10 DIAGNOSIS — D123 Benign neoplasm of transverse colon: Secondary | ICD-10-CM

## 2023-08-10 DIAGNOSIS — D127 Benign neoplasm of rectosigmoid junction: Secondary | ICD-10-CM | POA: Diagnosis not present

## 2023-08-10 DIAGNOSIS — Z860100 Personal history of colon polyps, unspecified: Secondary | ICD-10-CM

## 2023-08-10 MED ORDER — SODIUM CHLORIDE 0.9 % IV SOLN: 500.0000 mL | Freq: Once | INTRAVENOUS | Status: DC

## 2023-08-10 NOTE — Patient Instructions (Signed)

## 2023-08-10 NOTE — Progress Notes (Signed)
 Called to room to assist during endoscopic procedure.  Patient ID and intended procedure confirmed with present staff. Received instructions for my participation in the procedure from the performing physician.

## 2023-08-10 NOTE — Progress Notes (Signed)
 Updated medical record.

## 2023-08-10 NOTE — Op Note (Signed)
 [facility] Patient Name: Jesus [name] Procedure Date: 08/10/2023 3:47 PM MRN: [id number] Endoscopist: Yong Henle , MD, [id number] Age: 73 Referring MD:  Date of Birth: [birth year] Gender: Male Account #: [id number] Procedure:                Colonoscopy Indications:              Surveillance: Personal history of adenomatous                            polyps, inadequate prep on last colonoscopy (less                            than 1 year ago), High risk colon cancer                            surveillance: Personal history of adenoma (10 mm or                            greater in size), High risk colon cancer                            surveillance: Personal history of multiple (3 or                            more) adenomas Medicines:                Monitored Anesthesia Care Procedure:                Pre-Anesthesia Assessment:                           - Prior to the procedure, a History and Physical                            was performed, and patient medications and                            allergies were reviewed. The patient's tolerance of                            previous anesthesia was also reviewed. The risks                            and benefits of the procedure and the sedation                            options and risks were discussed with the patient.                            All questions were answered, and informed consent                            was obtained. Prior Anticoagulants: The patient has  taken no anticoagulant or antiplatelet agents                            except for aspirin. ASA Grade Assessment: III - A                            patient with severe systemic disease. After                            reviewing the risks and benefits, the patient was                            deemed in satisfactory condition to undergo the                            procedure.                            After obtaining informed consent, the colonoscope                            was passed under direct vision. Throughout the                            procedure, the patient's blood pressure, pulse, and                            oxygen saturations were monitored continuously. The                            Olympus Scope SN: [id number] was introduced through                            the anus and advanced to the 3 cm into the ileum.                            The colonoscopy was performed without difficulty.                            The patient tolerated the procedure. The quality of                            the bowel preparation was good. The terminal ileum,                            ileocecal valve, appendiceal orifice, and rectum                            were photographed. Scope In: 3:51:20 PM Scope Out: 4:05:03 PM Scope Withdrawal Time: 0 hours 11 minutes 0 seconds  Total Procedure Duration: 0 hours 13 minutes 43 seconds  Findings:                 Skin tags were found on perianal exam.  The digital rectal exam findings include                            hemorrhoids. Pertinent negatives include no                            palpable rectal lesions.                           The terminal ileum and ileocecal valve appeared                            normal.                           Four sessile polyps were found in the recto-sigmoid                            colon (1), transverse colon (1) and ascending colon                            (2). The polyps were 2 to 5 mm in size. These                            polyps were removed with a cold snare. Resection                            and retrieval were complete.                           Normal mucosa was found in the entire colon                            otherwise.                           Non-bleeding non-thrombosed internal hemorrhoids                            were found during retroflexion, during  perianal                            exam and during digital exam. The hemorrhoids were                            Grade II (internal hemorrhoids that prolapse but                            reduce spontaneously). Complications:            No immediate complications. Estimated Blood Loss:     Estimated blood loss was minimal. Impression:               - Perianal skin tags found on perianal exam.                           -  Hemorrhoids found on digital rectal exam.                           - The examined portion of the ileum was normal.                           - Four, 2 to 5 mm polyps at the recto-sigmoid                            colon, in the transverse colon and in the ascending                            colon, removed with a cold snare. Resected and                            retrieved.                           - Normal mucosa in the entire examined colon                            otherwise.                           - Non-bleeding non-thrombosed internal hemorrhoids. Recommendation:           - The patient will be observed post-procedure,                            until all discharge criteria are met.                           - Discharge patient to home.                           - Patient has a contact number available for                            emergencies. The signs and symptoms of potential                            delayed complications were discussed with the                            patient. Return to normal activities tomorrow.                            Written discharge instructions were provided to the                            patient.                           - High fiber diet.                           -  Use FiberCon 1-2 tablets PO daily.                           - Continue present medications.                           - Await pathology results.                           - Repeat colonoscopy in 3 years for surveillance,                            due  to overall number of polyps found within the                            last 2 exams done in the last 1.5 years.                           - Repeat colonoscopy will require the exact same                            preparation that he did for this procedure with                            regards to free preparation and preparation. This                            was the best visualization that we had from his                            recent colonoscopy attempts.                           - The findings and recommendations were discussed                            with the patient.                           - The findings and recommendations were discussed                            with the patient's family. Yong Henle, MD 08/10/2023 4:11:23 PM

## 2023-08-10 NOTE — Progress Notes (Signed)
 Vsss nad trans to pacu

## 2023-08-10 NOTE — Progress Notes (Signed)
 GASTROENTEROLOGY PROCEDURE H&P NOTE   Primary Care Physician: Sun, Vyvyan, MD  HPI: Jesus [name] is a 73 y.o. male who presents for Colonoscopy for surveillance of previous adenomas and previously incomplete/inadequate preparations.  Past Medical History:  Diagnosis Date   Anemia    in the past   Anxiety    Arthritis    in feet,knees   Depression    Diabetes (HCC)    GERD (gastroesophageal reflux disease)    Glaucoma    History of colon polyps    Hypertension    Low back pain    Past Surgical History:  Procedure Laterality Date   BACK SURGERY     25 years ago   COLONOSCOPY  08/24/2018   Current Outpatient Medications  Medication Sig Dispense Refill   FARXIGA 5 MG TABS tablet 1 tablet Orally Once a day for 90 days     fluticasone (FLONASE) 50 MCG/ACT nasal spray 1 spray in each nostril Nasally Twice a day for 30 days     OPTIVE 0.5-0.9 % ophthalmic solution 1 drop 4 (four) times daily.     pantoprazole (PROTONIX) 40 MG tablet 1 tablet 1/2 to 1 hour before morning meal Orally Once a day for 90 days     polyethylene glycol-electrolytes (NULYTELY) 420 g solution Take by mouth as directed.     rosuvastatin (CRESTOR) 20 MG tablet Oral     ACCU-CHEK SMARTVIEW test strip See admin instructions.  5   ARIPiprazole (ABILIFY) 5 MG tablet Take 5 mg by mouth daily. (Patient not taking: Reported on 07/09/2023)     aspirin 81 MG tablet Take 81 mg by mouth daily.     celecoxib (CELEBREX) 200 MG capsule Take by mouth. (Patient not taking: Reported on 07/09/2023)     esomeprazole (NEXIUM) 40 MG capsule Take 40 mg by mouth daily.     fenofibrate 160 MG tablet Take 160 mg by mouth daily.     gabapentin (NEURONTIN) 100 MG capsule SMARTSIG:2 Capsule(s) By Mouth Every Evening (Patient not taking: Reported on 07/09/2023)     glimepiride (AMARYL) 2 MG tablet Take 2 mg by mouth daily with breakfast.     ibuprofen (ADVIL) 200 MG tablet Take 200 mg by mouth every 6 (six) hours as needed.      lisinopril  (PRINIVIL ,ZESTRIL ) 5 MG tablet Take 1 tablet (5 mg total) by mouth daily. 30 tablet 0   loratadine (CLARITIN) 10 MG tablet Take 10 mg by mouth daily. (Patient not taking: Reported on 07/09/2023)     metFORMIN  (GLUCOPHAGE ) 1000 MG tablet Take 1,000 mg by mouth 2 (two) times daily.     montelukast (SINGULAIR) 10 MG tablet Take 10 mg by mouth at bedtime. (Patient not taking: Reported on 07/09/2023)     Multiple Vitamins-Minerals (CENTRUM SILVER PO) Take 1 tablet by mouth daily.     nabumetone (RELAFEN) 750 MG tablet Take 750 mg by mouth 2 (two) times daily.     naproxen (NAPROSYN) 500 MG tablet Take 500 mg by mouth 2 (two) times daily with a meal.     Omega 3-6-9 Fatty Acids (OMEGA 3-6-9 COMPLEX PO) Take 1 capsule by mouth daily.     omeprazole (PRILOSEC) 40 MG capsule Take 40 mg by mouth. Take one before dinner     pravastatin (PRAVACHOL) 10 MG tablet Take 10 mg by mouth daily.     tamsulosin (FLOMAX) 0.4 MG CAPS capsule Take 0.4 mg by mouth daily.     traZODone (DESYREL) 50 MG  tablet Take 50 mg by mouth at bedtime as needed.     zolpidem (AMBIEN) 5 MG tablet Take 5 mg by mouth at bedtime as needed.     Current Facility-Administered Medications  Medication Dose Route Frequency Provider Last Rate Last Admin   0.9 %  sodium chloride  infusion  500 mL Intravenous Once Mansouraty, Katori Wirsing Jr., MD        Current Outpatient Medications:    FARXIGA 5 MG TABS tablet, 1 tablet Orally Once a day for 90 days, Disp: , Rfl:    fluticasone (FLONASE) 50 MCG/ACT nasal spray, 1 spray in each nostril Nasally Twice a day for 30 days, Disp: , Rfl:    OPTIVE 0.5-0.9 % ophthalmic solution, 1 drop 4 (four) times daily., Disp: , Rfl:    pantoprazole (PROTONIX) 40 MG tablet, 1 tablet 1/2 to 1 hour before morning meal Orally Once a day for 90 days, Disp: , Rfl:    polyethylene glycol-electrolytes (NULYTELY) 420 g solution, Take by mouth as directed., Disp: , Rfl:    rosuvastatin (CRESTOR) 20 MG tablet, Oral,  Disp: , Rfl:    ACCU-CHEK SMARTVIEW test strip, See admin instructions., Disp: , Rfl: 5   ARIPiprazole (ABILIFY) 5 MG tablet, Take 5 mg by mouth daily. (Patient not taking: Reported on 07/09/2023), Disp: , Rfl:    aspirin 81 MG tablet, Take 81 mg by mouth daily., Disp: , Rfl:    celecoxib (CELEBREX) 200 MG capsule, Take by mouth. (Patient not taking: Reported on 07/09/2023), Disp: , Rfl:    esomeprazole (NEXIUM) 40 MG capsule, Take 40 mg by mouth daily., Disp: , Rfl:    fenofibrate 160 MG tablet, Take 160 mg by mouth daily., Disp: , Rfl:    gabapentin (NEURONTIN) 100 MG capsule, SMARTSIG:2 Capsule(s) By Mouth Every Evening (Patient not taking: Reported on 07/09/2023), Disp: , Rfl:    glimepiride (AMARYL) 2 MG tablet, Take 2 mg by mouth daily with breakfast., Disp: , Rfl:    ibuprofen (ADVIL) 200 MG tablet, Take 200 mg by mouth every 6 (six) hours as needed., Disp: , Rfl:    lisinopril  (PRINIVIL ,ZESTRIL ) 5 MG tablet, Take 1 tablet (5 mg total) by mouth daily., Disp: 30 tablet, Rfl: 0   loratadine (CLARITIN) 10 MG tablet, Take 10 mg by mouth daily. (Patient not taking: Reported on 07/09/2023), Disp: , Rfl:    metFORMIN  (GLUCOPHAGE ) 1000 MG tablet, Take 1,000 mg by mouth 2 (two) times daily., Disp: , Rfl:    montelukast (SINGULAIR) 10 MG tablet, Take 10 mg by mouth at bedtime. (Patient not taking: Reported on 07/09/2023), Disp: , Rfl:    Multiple Vitamins-Minerals (CENTRUM SILVER PO), Take 1 tablet by mouth daily., Disp: , Rfl:    nabumetone (RELAFEN) 750 MG tablet, Take 750 mg by mouth 2 (two) times daily., Disp: , Rfl:    naproxen (NAPROSYN) 500 MG tablet, Take 500 mg by mouth 2 (two) times daily with a meal., Disp: , Rfl:    Omega 3-6-9 Fatty Acids (OMEGA 3-6-9 COMPLEX PO), Take 1 capsule by mouth daily., Disp: , Rfl:    omeprazole (PRILOSEC) 40 MG capsule, Take 40 mg by mouth. Take one before dinner, Disp: , Rfl:    pravastatin (PRAVACHOL) 10 MG tablet, Take 10 mg by mouth daily., Disp: , Rfl:     tamsulosin (FLOMAX) 0.4 MG CAPS capsule, Take 0.4 mg by mouth daily., Disp: , Rfl:    traZODone (DESYREL) 50 MG tablet, Take 50 mg by mouth at bedtime as needed.,  Disp: , Rfl:    zolpidem (AMBIEN) 5 MG tablet, Take 5 mg by mouth at bedtime as needed., Disp: , Rfl:   Current Facility-Administered Medications:    0.9 %  sodium chloride  infusion, 500 mL, Intravenous, Once, Mansouraty, Albino Alu., MD No Known Allergies Family History  Problem Relation Age of Onset   Anemia Mother    Arthritis Mother    Diabetes Father    Hypertension Father    Diabetes Sister    Ovarian cancer Sister    Other Sister    Diabetes Brother    Colon cancer Neg Hx    Esophageal cancer Neg Hx    Stomach cancer Neg Hx    Rectal cancer Neg Hx    Inflammatory bowel disease Neg Hx    Liver disease Neg Hx    Pancreatic cancer Neg Hx    Social History   Socioeconomic History   Marital status: Single    Spouse name: Not on file   Number of children: 4   Years of education: Not on file   Highest education level: Not on file  Occupational History   Not on file  Tobacco Use   Smoking status: Never   Smokeless tobacco: Never  Vaping Use   Vaping status: Never Used  Substance and Sexual Activity   Alcohol use: No   Drug use: No   Sexual activity: Not on file  Other Topics Concern   Not on file  Social History Narrative   Not on file   Social Drivers of Health   Financial Resource Strain: Not on file  Food Insecurity: Not on file  Transportation Needs: Not on file  Physical Activity: Not on file  Stress: Not on file  Social Connections: Not on file  Intimate Partner Violence: Not on file    Physical Exam: Today's Vitals   08/10/23 1503  BP: 130/84  Pulse: 72  Temp: 97.9 F (36.6 C)  TempSrc: Temporal  SpO2: 97%  Weight: 158 lb 3.2 oz (71.8 kg)  Height: 5\' 7"  (1.702 m)   Body mass index is 24.78 kg/m. GEN: NAD EYE: Sclerae anicteric ENT: MMM CV: Non-tachycardic GI: Soft,  NT/ND NEURO:  Alert & Oriented x 3  Lab Results: No results for input(s): "WBC", "HGB", "HCT", "PLT" in the last 72 hours. BMET No results for input(s): "NA", "K", "CL", "CO2", "GLUCOSE", "BUN", "CREATININE", "CALCIUM" in the last 72 hours. LFT No results for input(s): "PROT", "ALBUMIN", "AST", "ALT", "ALKPHOS", "BILITOT", "BILIDIR", "IBILI" in the last 72 hours. PT/INR No results for input(s): "LABPROT", "INR" in the last 72 hours.   Impression / Plan: This is a 73 y.o.male who presents for Colonoscopy for surveillance of previous adenomas and previously incomplete/inadequate preparations.  The risks and benefits of endoscopic evaluation/treatment were discussed with the patient and/or family; these include but are not limited to the risk of perforation, infection, bleeding, missed lesions, lack of diagnosis, severe illness requiring hospitalization, as well as anesthesia and sedation related illnesses.  The patient's history has been reviewed, patient examined, no change in status, and deemed stable for procedure.  The patient and/or family is agreeable to proceed.    Yong Henle, MD [facility] Office # [phone]

## 2023-08-11 ENCOUNTER — Telehealth: Payer: Self-pay | Admitting: *Deleted

## 2023-08-11 NOTE — Telephone Encounter (Signed)
 No answer on follow up call. Voice mail not set up . Unable to leave a message

## 2023-08-13 LAB — SURGICAL PATHOLOGY

## 2023-08-15 ENCOUNTER — Ambulatory Visit: Payer: Self-pay | Admitting: Gastroenterology

## 2023-10-28 IMAGING — CR DG LUMBAR SPINE COMPLETE 4+V
5 series · 5 of 5 positions shown · non-contrast
Comparison: Lumbar spine x-ray 11/25/2009

CLINICAL DATA: Chronic low back pain

EXAM:
LUMBAR SPINE - COMPLETE 4+ VIEW

[t lumbar spine ap]
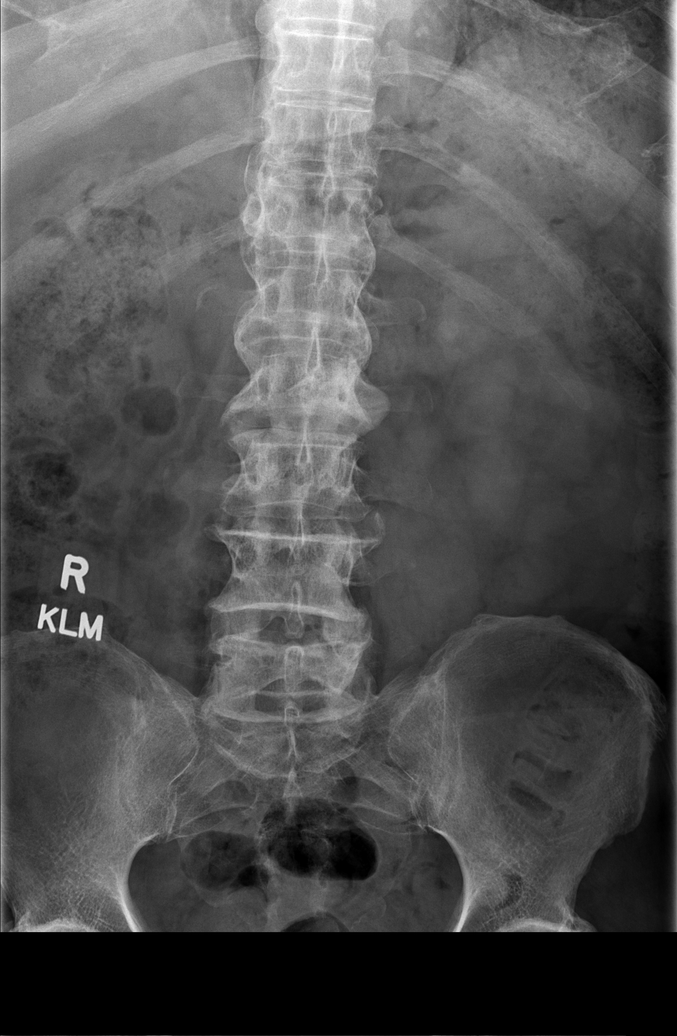

[t lumbar spine obl (1 of 2)]
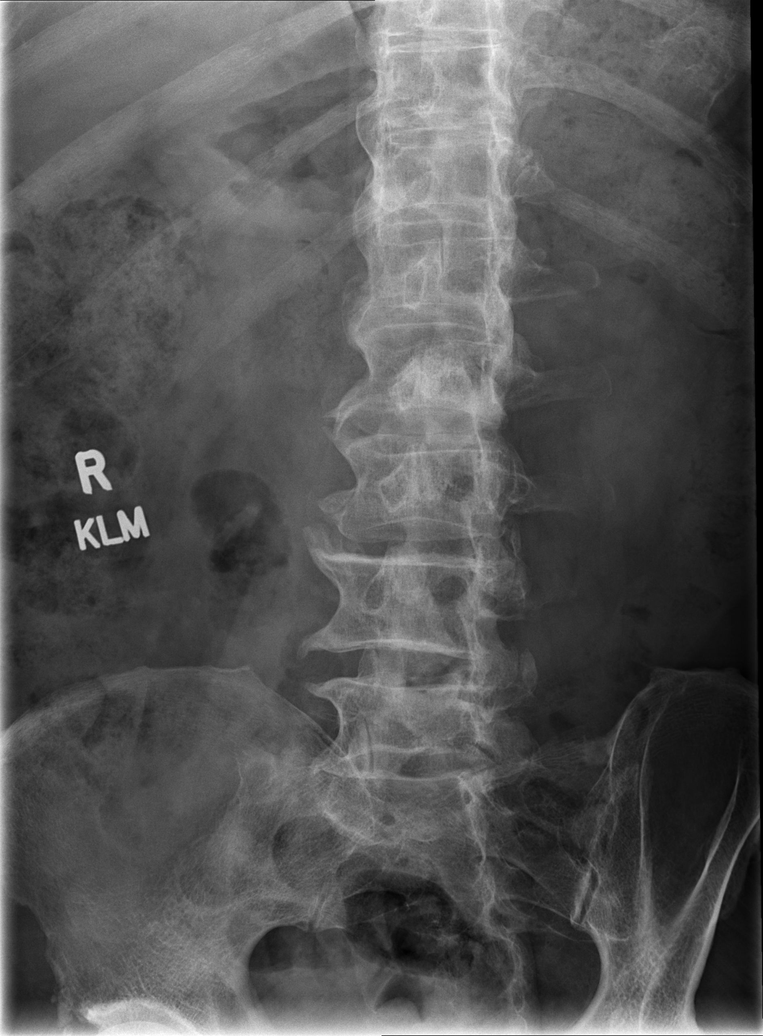

[t lumbar spine obl (2 of 2)]
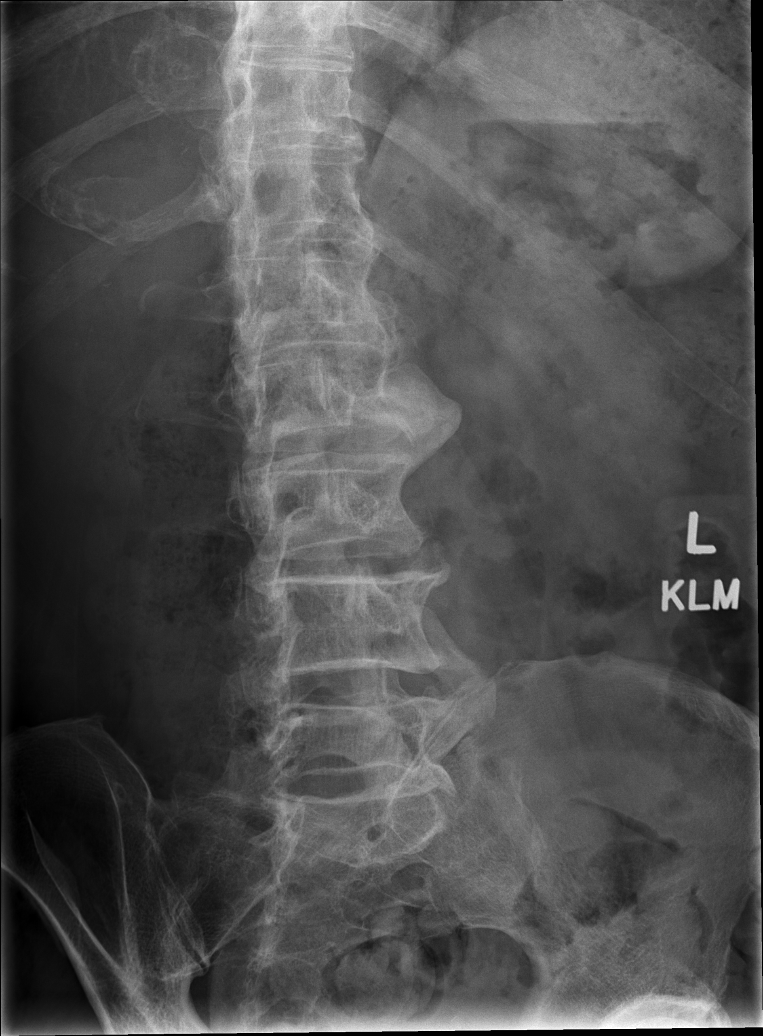

[t lumbar spine lat]
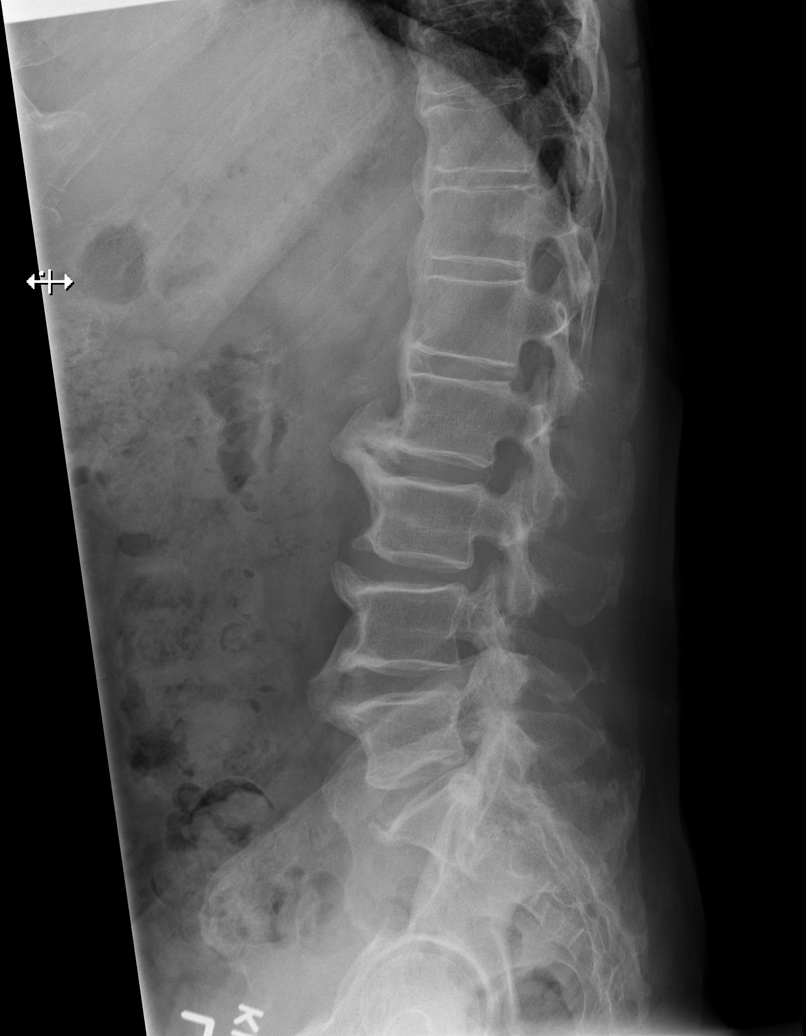

[t lumbar l-5 s-1 spot]
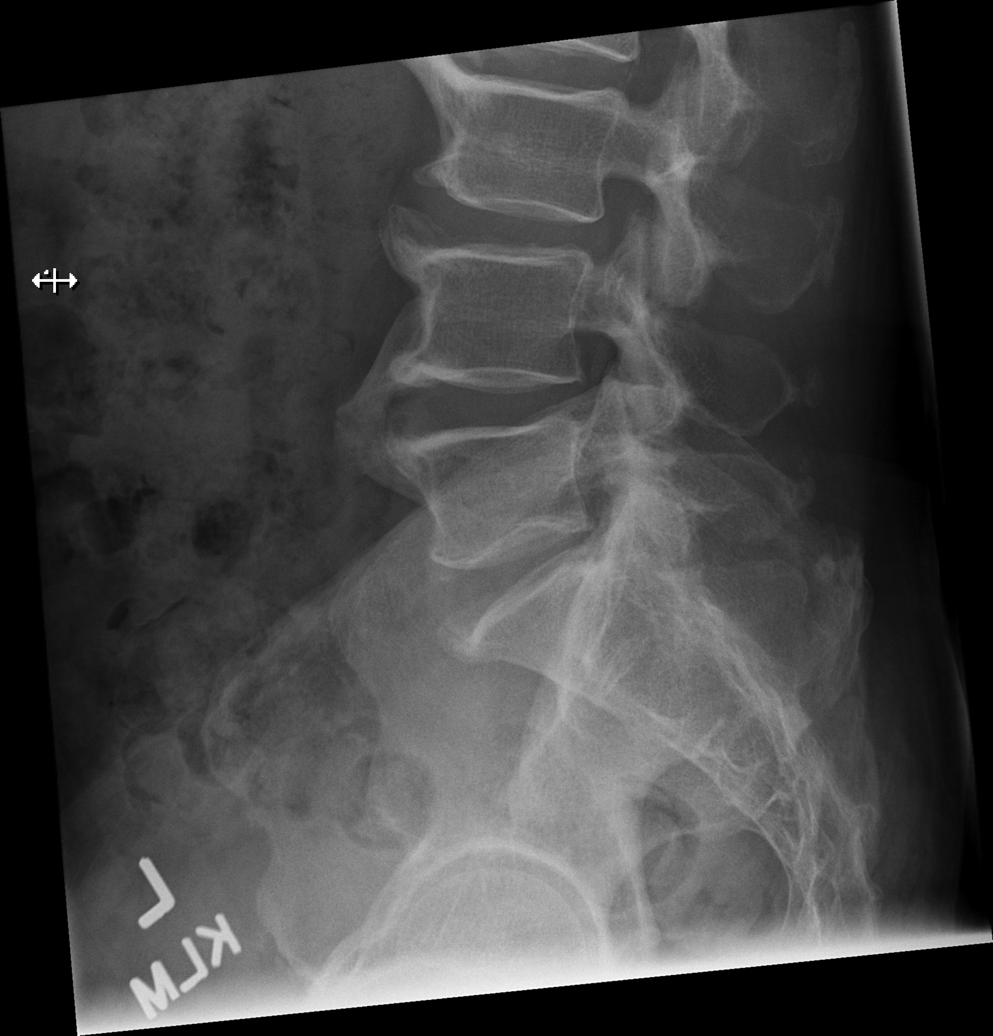

[5 of 5 positions shown; findings below may reference images not displayed]

FINDINGS: Lumbar vertebral body height and alignment are preserved without
fracture or spondylolisthesis. No spondylolysis identified. Mild
intervertebral disc space narrowing throughout the lumbar spine.
Mild facet arthropathy in the lower lumbar spine. Large anterior
endplate osteophytes noted.
IMPRESSION: Degenerative changes of the lumbar spine with no acute fracture
identified.
